# Patient Record
Sex: Female | Born: 1935 | Race: Black or African American | Hispanic: No | Smoking: Never smoker
Health system: Southern US, Community
[De-identification: ages and names within clinical notes are randomized; demographics above are authoritative.]

## PROBLEM LIST (undated history)

## (undated) DIAGNOSIS — I2510 Atherosclerotic heart disease of native coronary artery without angina pectoris: Secondary | ICD-10-CM

## (undated) DIAGNOSIS — N83209 Unspecified ovarian cyst, unspecified side: Secondary | ICD-10-CM

## (undated) DIAGNOSIS — I10 Essential (primary) hypertension: Secondary | ICD-10-CM

## (undated) DIAGNOSIS — I110 Hypertensive heart disease with heart failure: Secondary | ICD-10-CM

## (undated) DIAGNOSIS — R627 Adult failure to thrive: Secondary | ICD-10-CM

## (undated) DIAGNOSIS — A09 Infectious gastroenteritis and colitis, unspecified: Secondary | ICD-10-CM

## (undated) DIAGNOSIS — I351 Nonrheumatic aortic (valve) insufficiency: Secondary | ICD-10-CM

## (undated) DIAGNOSIS — R55 Syncope and collapse: Secondary | ICD-10-CM

## (undated) DIAGNOSIS — F0390 Unspecified dementia without behavioral disturbance: Secondary | ICD-10-CM

## (undated) DIAGNOSIS — E785 Hyperlipidemia, unspecified: Secondary | ICD-10-CM

## (undated) DIAGNOSIS — I43 Cardiomyopathy in diseases classified elsewhere: Secondary | ICD-10-CM

## (undated) DIAGNOSIS — I422 Other hypertrophic cardiomyopathy: Principal | ICD-10-CM

## (undated) DIAGNOSIS — I119 Hypertensive heart disease without heart failure: Secondary | ICD-10-CM

## (undated) HISTORY — DX: Syncope and collapse: R55

## (undated) HISTORY — DX: Other hypertrophic cardiomyopathy: I42.2

## (undated) HISTORY — DX: Hypertensive heart disease without heart failure: I11.9

## (undated) HISTORY — DX: Nonrheumatic aortic (valve) insufficiency: I35.1

## (undated) HISTORY — DX: Adult failure to thrive: R62.7

## (undated) HISTORY — DX: Hypertensive heart disease with heart failure: I11.0

---

## 2014-09-08 ENCOUNTER — Encounter (HOSPITAL_COMMUNITY): Payer: Self-pay | Admitting: Emergency Medicine

## 2014-09-08 ENCOUNTER — Observation Stay (HOSPITAL_COMMUNITY): Payer: Medicare Other

## 2014-09-08 ENCOUNTER — Observation Stay (HOSPITAL_COMMUNITY)
Admission: EM | Admit: 2014-09-08 | Discharge: 2014-09-10 | Disposition: A | Discharge status: Home / Self Care | Payer: Medicare Other | Attending: Family Medicine | Admitting: Family Medicine

## 2014-09-08 ENCOUNTER — Emergency Department (HOSPITAL_COMMUNITY): Payer: Medicare Other

## 2014-09-08 DIAGNOSIS — I119 Hypertensive heart disease without heart failure: Secondary | ICD-10-CM | POA: Diagnosis not present

## 2014-09-08 DIAGNOSIS — F0390 Unspecified dementia without behavioral disturbance: Secondary | ICD-10-CM | POA: Diagnosis not present

## 2014-09-08 DIAGNOSIS — I2510 Atherosclerotic heart disease of native coronary artery without angina pectoris: Secondary | ICD-10-CM | POA: Insufficient documentation

## 2014-09-08 DIAGNOSIS — I43 Cardiomyopathy in diseases classified elsewhere: Secondary | ICD-10-CM | POA: Diagnosis not present

## 2014-09-08 DIAGNOSIS — E785 Hyperlipidemia, unspecified: Secondary | ICD-10-CM | POA: Diagnosis not present

## 2014-09-08 DIAGNOSIS — R55 Syncope and collapse: Secondary | ICD-10-CM | POA: Diagnosis not present

## 2014-09-08 DIAGNOSIS — I10 Essential (primary) hypertension: Secondary | ICD-10-CM

## 2014-09-08 DIAGNOSIS — I252 Old myocardial infarction: Secondary | ICD-10-CM | POA: Diagnosis not present

## 2014-09-08 HISTORY — DX: Essential (primary) hypertension: I10

## 2014-09-08 HISTORY — DX: Hyperlipidemia, unspecified: E78.5

## 2014-09-08 HISTORY — DX: Cardiomyopathy in diseases classified elsewhere: I43

## 2014-09-08 HISTORY — DX: Infectious gastroenteritis and colitis, unspecified: A09

## 2014-09-08 HISTORY — DX: Unspecified ovarian cyst, unspecified side: N83.209

## 2014-09-08 HISTORY — DX: Unspecified dementia, unspecified severity, without behavioral disturbance, psychotic disturbance, mood disturbance, and anxiety: F03.90

## 2014-09-08 HISTORY — DX: Hypertensive heart disease without heart failure: I11.9

## 2014-09-08 HISTORY — DX: Atherosclerotic heart disease of native coronary artery without angina pectoris: I25.10

## 2014-09-08 LAB — COMPREHENSIVE METABOLIC PANEL
ALK PHOS: 67 U/L (ref 39–117)
ALT: 8 U/L (ref 0–35)
AST: 17 U/L (ref 0–37)
Albumin: 3.2 g/dL — ABNORMAL LOW (ref 3.5–5.2)
Anion gap: 10 (ref 5–15)
BILIRUBIN TOTAL: 0.5 mg/dL (ref 0.3–1.2)
BUN: 11 mg/dL (ref 6–23)
CALCIUM: 10.1 mg/dL (ref 8.4–10.5)
CHLORIDE: 106 meq/L (ref 96–112)
CO2: 26 meq/L (ref 19–32)
Creatinine, Ser: 0.89 mg/dL (ref 0.50–1.10)
GFR calc non Af Amer: 57 mL/min — ABNORMAL LOW (ref >90)
GFR, EST AFRICAN AMERICAN: 67 mL/min — AB (ref >90)
GLUCOSE: 127 mg/dL — AB (ref 70–99)
Potassium: 3.5 mEq/L — ABNORMAL LOW (ref 3.7–5.3)
SODIUM: 142 meq/L (ref 137–147)
Total Protein: 6.8 g/dL (ref 6.0–8.3)

## 2014-09-08 LAB — URINALYSIS, ROUTINE W REFLEX MICROSCOPIC
BILIRUBIN URINE: NEGATIVE
Glucose, UA: NEGATIVE mg/dL
HGB URINE DIPSTICK: NEGATIVE
KETONES UR: NEGATIVE mg/dL
Leukocytes, UA: NEGATIVE
Nitrite: NEGATIVE
PH: 7 (ref 5.0–8.0)
PROTEIN: NEGATIVE mg/dL
Specific Gravity, Urine: 1.011 (ref 1.005–1.030)
Urobilinogen, UA: 1 mg/dL (ref 0.0–1.0)

## 2014-09-08 LAB — CBC WITH DIFFERENTIAL/PLATELET
BASOS ABS: 0 10*3/uL (ref 0.0–0.1)
Basophils Relative: 0 % (ref 0–1)
Eosinophils Absolute: 0 10*3/uL (ref 0.0–0.7)
Eosinophils Relative: 1 % (ref 0–5)
HEMATOCRIT: 32.9 % — AB (ref 36.0–46.0)
Hemoglobin: 11.1 g/dL — ABNORMAL LOW (ref 12.0–15.0)
LYMPHS ABS: 1.2 10*3/uL (ref 0.7–4.0)
LYMPHS PCT: 19 % (ref 12–46)
MCH: 27.6 pg (ref 26.0–34.0)
MCHC: 33.7 g/dL (ref 30.0–36.0)
MCV: 81.8 fL (ref 78.0–100.0)
Monocytes Absolute: 0.5 10*3/uL (ref 0.1–1.0)
Monocytes Relative: 8 % (ref 3–12)
NEUTROS ABS: 4.5 10*3/uL (ref 1.7–7.7)
Neutrophils Relative %: 72 % (ref 43–77)
PLATELETS: 232 10*3/uL (ref 150–400)
RBC: 4.02 MIL/uL (ref 3.87–5.11)
RDW: 14.7 % (ref 11.5–15.5)
WBC: 6.2 10*3/uL (ref 4.0–10.5)

## 2014-09-08 LAB — CBG MONITORING, ED: Glucose-Capillary: 107 mg/dL — ABNORMAL HIGH (ref 70–99)

## 2014-09-08 LAB — TROPONIN I: Troponin I: <0.3 ng/mL (ref <0.30)

## 2014-09-08 MED ORDER — ASPIRIN 81 MG PO CHEW
81.0000 mg | CHEWABLE_TABLET | Freq: Every day | ORAL | Status: DC
  Administered 2014-09-09 – 2014-09-10 (×2): 81 mg via ORAL
  Filled 2014-09-08 (×3): qty 1

## 2014-09-08 MED ORDER — ATORVASTATIN CALCIUM 80 MG PO TABS
80.0000 mg | ORAL_TABLET | Freq: Every day | ORAL | Status: DC
  Administered 2014-09-09 – 2014-09-10 (×2): 80 mg via ORAL
  Filled 2014-09-08 (×2): qty 1

## 2014-09-08 MED ORDER — CARVEDILOL 12.5 MG PO TABS
12.5000 mg | ORAL_TABLET | Freq: Two times a day (BID) | ORAL | Status: DC
  Administered 2014-09-09 – 2014-09-10 (×4): 12.5 mg via ORAL
  Filled 2014-09-08 (×6): qty 1

## 2014-09-08 MED ORDER — SODIUM CHLORIDE 0.9 % IJ SOLN
3.0000 mL | Freq: Two times a day (BID) | INTRAMUSCULAR | Status: DC
  Administered 2014-09-09 (×2): 3 mL via INTRAVENOUS

## 2014-09-08 MED ORDER — SODIUM CHLORIDE 0.9 % IJ SOLN
3.0000 mL | Freq: Two times a day (BID) | INTRAMUSCULAR | Status: DC
  Administered 2014-09-10: 3 mL via INTRAVENOUS

## 2014-09-08 MED ORDER — SODIUM CHLORIDE 0.9 % IV SOLN: INTRAVENOUS | Status: AC

## 2014-09-08 MED ORDER — SODIUM CHLORIDE 0.9 % IV SOLN
1000.0000 mL | INTRAVENOUS | Status: DC
  Administered 2014-09-08: 1000 mL via INTRAVENOUS

## 2014-09-08 MED ORDER — SODIUM CHLORIDE 0.9 % IJ SOLN
3.0000 mL | INTRAMUSCULAR | Status: DC | PRN
Start: 2014-09-08 — End: 2014-09-10

## 2014-09-08 MED ORDER — SODIUM CHLORIDE 0.9 % IV SOLN: 250.0000 mL | INTRAVENOUS | Status: DC | PRN

## 2014-09-08 MED ORDER — LISINOPRIL 40 MG PO TABS
40.0000 mg | ORAL_TABLET | Freq: Every day | ORAL | Status: DC
  Administered 2014-09-09 – 2014-09-10 (×2): 40 mg via ORAL
  Filled 2014-09-08 (×2): qty 1

## 2014-09-08 MED ORDER — POTASSIUM CHLORIDE CRYS ER 20 MEQ PO TBCR
40.0000 meq | EXTENDED_RELEASE_TABLET | Freq: Once | ORAL | Status: AC
  Administered 2014-09-09: 40 meq via ORAL
  Filled 2014-09-08: qty 2

## 2014-09-08 NOTE — H&P (Signed)
Family Medicine Teaching [facility] Admission History and Physical Service Pager: [phone]  Patient name: Charlotte Schultz Medical record number: [id number] Date of birth: 21-Apr-1927 Age: 80 y.o. Gender: female  Primary Care Provider: No primary provider on file. Consultants: None Code Status: Full Code  Chief Complaint: Syncope  Assessment and Plan: Devita Hewatt is a 80 y.o. female presenting with 2 syncopal episodes . PMH is significant for HTN, HLD, CAD, dementia, and history of MI.  #Syncope - 2 witnessed episodes. Unclear etiology at this time. DDx includes neurocardiogenic(per history does not seem to be situational or positional in nature) vs cardiovascular(EKG showed NSR with some T wave abnormalities; no arrhythmias noted. Patient does have murmur on exam and very wide pulse pressure) vs neurologic(does not appear to be seizure-like activity and patient has no history of seizures; also normal neuro exam excluding known dementia).  Patient with no current evidence of dehydration, infection.   -Admit to telemetry -CXR showed no active cardiopulmonary disease -CT Head ordered given fall -repeat EKG in am -echo to assess for underlying valvular/cardiac abnormalities -will cycle troponin x 3  -TSH, A1C ordered  -will get orthostatics in the morning -fall precautions  #HTN - BPs currently elevated.  -Continued home Coreg, Lisinopril and withheld Norvasc and hydralazine at this time pending CT head  -Monitor pressures and add back medication as needed.  #HLD  -Continue home Lipitor 80mg   #CAD - Hx of MI about 10 years ago. -Will continue home aspirin, statin, and BP meds as noted above.  #Dementia - Patient currently not oriented. Not on any medications for this. Patient gets assistance with daily ADLs. Likely advanced given history and duration from family members. -Unable to get MMSE -Patient unlikely to receive benefit from medications if dementia is advanced  (anticipate this is the case).  Will need to assess MMSE during admission.  -Consider MRI during admission for work up -Obtaining TSH and Anemia Panel  FEN/GI: Heart Healthy Diet.  Prophylaxis: SCDs in setting of recent fall (if CT negative will proceed with Heparin).   Disposition: Admit to telemetry under attending Dr. Gwendolyn Grant.   History of Present Illness: Charlotte Schultz is a 80 y.o. female presenting with 2 witnessed syncopal episodes. PMH is significant for HTN, HLD, CAD, dementia, and history of MI.  Level 5 caveat due to dementia. Below history was provided by family mainly daughter in law.   Patient was leaving car going to apartment with daughter in law when she passed out. She came to a couple minutes later and then loss consciousness again. She did not hit her head or become incontinent when she passed out. No reported seizure like activity. After second episode EMS was called.  Patient was alert and at baseline a few minutes following syncope.  She has been in her normal state of health per family. These episodes occurred without prodrome of symptoms. Patient denies any symptoms at this time.   Family states the patient has been evaluated for syncope in the past, at another medical facility, without clear etiology demonstrated. When this has happened previously patient usually was dehydrated or had an infection. She also would have an episode of vomiting which did not occur this time.  No report of new medication changes or behavioral changes.   Patient currently denies chest pain, SOB, nausea, vomiting.  She and daughter in law report good PO intake.  Of note, patient lives with son and daughter in law.  Daughter in law is primary caregiver.  Patient is  ambulatory (no assistive devices); needs assistance with ADL's.   Review Of Systems: Per HPI. Otherwise 12 point review of systems was performed and was unremarkable.  Patient Active Problem List   Diagnosis Date Noted  .  Syncope and collapse 09/08/2014   Past Medical History: Past Medical History  Diagnosis Date  . Hypertension   . Infectious colitis   . HLD (hyperlipidemia)   . Hypertensive cardiomyopathy   . Ovarian cyst   . CAD (coronary artery disease)   . Dementia    Past Surgical History: History reviewed. No pertinent past surgical history. Social History: History  Substance Use Topics  . Smoking status: Not on file  . Smokeless tobacco: Not on file  . Alcohol Use: Not on file   Additional social history: Lives with son and daughter in law. Please also refer to relevant sections of EMR.  Family History: History reviewed. No pertinent family history. Allergies and Medications: Allergies  Allergen Reactions  . Zetia [Ezetimibe]     unknown  . Aspirin Rash   No current facility-administered medications on file prior to encounter.   No current outpatient prescriptions on file prior to encounter.    Objective: BP 199/51  Pulse 89  Temp(Src) 98.1 F (36.7 C) (Oral)  Resp 18  Ht 5\' 6"  (1.676 m)  Wt 120 lb 3.2 oz (54.522 kg)  BMI 19.41 kg/m2  SpO2 100% Exam: General: alert, well-developed, NAD, cooperative HEENT: NCAT. Vision grossly intact, PERRLA, no injection and anicteric. Extraocular motion intact. MMM Oral mucosa and oropharynx reveal no lesions or exudates. Poor dentition. Neck: supple, full ROM, no thyromegaly. No deformities, masses, or tenderness noted.  Lungs: CTAB, normal respiratory effort, no accessory muscle use, no crackles, and no wheezes.  Heart: RRR, 3/6 blowing systolic murmur.  Abdomen: Bowel sounds normal; abdomen soft and nontender. No masses, organomegaly or hernias noted. no guarding, no rebound tenderness. Lower abdominal transverse scar. Msk: no joint swelling, no joint warmth, and no redness over joints. Pulses: DP/PT are full and equal bilaterally.  Extremities: No cyanosis, clubbing, edema. Pressure soars on bilateral feet.  Neurologic: No focal  deficits, CN grossly intact. 5/5 muscle strength in all extremities; Sensation grossly intact. Alert but not oriented. Skin: Intact without suspicious lesions or rashes. Warm and dry.   Labs and Imaging: CBC BMET   Recent Labs Lab 09/08/14 1835  WBC 6.2  HGB 11.1*  HCT 32.9*  PLT 232    Recent Labs Lab 09/08/14 1835  NA 142  K 3.5*  CL 106  CO2 26  BUN 11  CREATININE 0.89  GLUCOSE 127*  CALCIUM 10.1     Urinalysis    Component Value Date/Time   COLORURINE YELLOW 09/08/2014 1850   APPEARANCEUR CLEAR 09/08/2014 1850   LABSPEC 1.011 09/08/2014 1850   PHURINE 7.0 09/08/2014 1850   GLUCOSEU NEGATIVE 09/08/2014 1850   HGBUR NEGATIVE 09/08/2014 1850   BILIRUBINUR NEGATIVE 09/08/2014 1850   KETONESUR NEGATIVE 09/08/2014 1850   PROTEINUR NEGATIVE 09/08/2014 1850   UROBILINOGEN 1.0 09/08/2014 1850   NITRITE NEGATIVE 09/08/2014 1850   LEUKOCYTESUR NEGATIVE 09/08/2014 1850   Trop neg x1  Dg Chest 2 View 09/08/2014   CLINICAL DATA:  Pt from home with two unwitnessed syncopal episodes. Unknown how long pt was unconscious for. Pt with no complaints or neurological deficits. Pt hypertensive with EMS and hypertensive on arrival. Hx of dementia.  EXAM: CHEST  2 VIEW  COMPARISON:  None.  FINDINGS: There is no focal parenchymal opacity, pleural  effusion, or pneumothorax. There is cardiomegaly. There is thoracic aortic atherosclerosis.  There is a levoscoliosis of the thoracolumbar spine. There is severe osteoarthritis of the left glenohumeral joint.  IMPRESSION: No active cardiopulmonary disease.   Electronically Signed   By: Elige Ko   On: 09/08/2014 18:31   Pincus Large, DO 09/08/2014, 11:02 PM PGY-1, [facility] FPTS Intern pager: [phone], text pages welcome  I have seen and evaluated the above patient.  Addendum in blue.  Everlene Other DO Family Medicine PGY-3

## 2014-09-08 NOTE — ED Notes (Signed)
Reports given to RN but room is not ready at this time. Floor will contact when ready.

## 2014-09-08 NOTE — ED Notes (Signed)
Pt from home with two unwitnessed syncopal episodes.  Unknown how long pt was unconscious for.  Pt with no complaints or neurological deficits.  Pt hypertensive with EMS and hypertensive on arrival.  Pt with hx of dementia.  Family en route. IV established by EMS.

## 2014-09-08 NOTE — ED Notes (Signed)
CBG 107 

## 2014-09-08 NOTE — ED Provider Notes (Signed)
CSN: [id number]     Arrival date & time 09/08/14  1706 History   First MD Initiated Contact with Patient 09/08/14 1724     Chief Complaint  Patient presents with  . Loss of Consciousness      HPI  Patient presents after 2 episodes of syncope. Patient has dementia, level V caveat. Course the patient's family is here, provides details of the history of present illness.  1 daughter notes that twice today, without clear prodrome, the patient had brief loss of consciousness. The patient did not report pain either before or after each episode. Currently the patient denies pain, lightheadedness, nausea, any focal complaints. Family states the patient has been evaluated for syncope in the past, at another medical facility, without clear etiology demonstrated. No report of new medication changes, and I changed her activity changes, and no behavioral changes  Past Medical History  Diagnosis Date  . Hypertension   . Infectious colitis   . HLD (hyperlipidemia)   . Hypertensive cardiomyopathy   . Ovarian cyst    History reviewed. No pertinent past surgical history. History reviewed. No pertinent family history. History  Substance Use Topics  . Smoking status: Not on file  . Smokeless tobacco: Not on file  . Alcohol Use: Not on file   OB History   Grav Para Term Preterm Abortions TAB SAB Ect Mult Living                 Review of Systems  Unable to perform ROS: Dementia      Allergies  Zetia and Aspirin  Home Medications   Prior to Admission medications   Medication Sig Start Date End Date Taking? Authorizing Provider  amLODipine (NORVASC) 5 MG tablet Take 5 mg by mouth daily.   Yes Historical Provider, MD  aspirin 81 MG tablet Take 81 mg by mouth daily.   Yes Historical Provider, MD  atorvastatin (LIPITOR) 80 MG tablet Take 80 mg by mouth daily.   Yes Historical Provider, MD  carvedilol (COREG) 12.5 MG tablet Take 12.5 mg by mouth every 12 (twelve) hours.   Yes Historical  Provider, MD  hydrALAZINE (APRESOLINE) 25 MG tablet Take 25 mg by mouth daily.   Yes Historical Provider, MD  lisinopril (PRINIVIL,ZESTRIL) 40 MG tablet Take 40 mg by mouth daily.   Yes Historical Provider, MD   BP 167/58  Pulse 80  Temp(Src) 99 F (37.2 C) (Oral)  Resp 17  SpO2 100% Physical Exam  Nursing note and vitals reviewed. Constitutional: She appears listless. She has a sickly appearance. No distress.  Eyes: Conjunctivae are normal. Right eye exhibits no discharge. Left eye exhibits no discharge.  Neck: No tracheal deviation present.  Cardiovascular: Normal rate and regular rhythm.   Murmur heard. Pulmonary/Chest: Effort normal. No stridor. No respiratory distress.  Abdominal: She exhibits no distension. There is no tenderness.  Musculoskeletal:  Diffuse atrophy, no asymmetry, no gross evidence of trauma.  Neurological: She appears listless. She displays atrophy. She displays no tremor. She exhibits abnormal muscle tone. She displays no seizure activity. Coordination abnormal.  No facial asymmetry, speech clear, brief. Patient does not follow commands for neurologic exam reliably, including a demonstration of or cerebellar function, poor command following    ED Course  Procedures (including critical care time) Labs Review Labs Reviewed  CBC WITH DIFFERENTIAL - Abnormal; Notable for the following:    Hemoglobin 11.1 (*)    HCT 32.9 (*)    All other components within normal limits  COMPREHENSIVE METABOLIC PANEL - Abnormal; Notable for the following:    Potassium 3.5 (*)    Glucose, Bld 127 (*)    Albumin 3.2 (*)    GFR calc non Af Amer 57 (*)    GFR calc Af Amer 67 (*)    All other components within normal limits  CBG MONITORING, ED - Abnormal; Notable for the following:    Glucose-Capillary 107 (*)    All other components within normal limits  TROPONIN I  URINALYSIS, ROUTINE W REFLEX MICROSCOPIC  POCT CBG (FASTING - GLUCOSE)-MANUAL ENTRY    Imaging Review Dg  Chest 2 View  09/08/2014   CLINICAL DATA:  Pt from home with two unwitnessed syncopal episodes. Unknown how long pt was unconscious for. Pt with no complaints or neurological deficits. Pt hypertensive with EMS and hypertensive on arrival. Hx of dementia.  EXAM: CHEST  2 VIEW  COMPARISON:  None.  FINDINGS: There is no focal parenchymal opacity, pleural effusion, or pneumothorax. There is cardiomegaly. There is thoracic aortic atherosclerosis.  There is a levoscoliosis of the thoracolumbar spine. There is severe osteoarthritis of the left glenohumeral joint.  IMPRESSION: No active cardiopulmonary disease.   Electronically Signed   By: Elige KoHetal  Patel   On: 09/08/2014 18:31     EKG Interpretation   Date/Time:  Thursday September 08 2014 17:14:21 EDT Ventricular Rate:  83 PR Interval:  154 QRS Duration: 96 QT Interval:  392 QTC Calculation: 461 R Axis:   -39 Text Interpretation:  Sinus rhythm Left atrial enlargement LVH with  secondary repolarization abnormality Anterior Q waves, possibly due to LVH  Sinus rhythm T wave abnormality Left ventricular hypertrophy Abnormal ekg  Confirmed by Gerhard MunchLOCKWOOD, Jacole Capley  MD [phone] on 09/08/2014 7:26:16 PM     On repeat exam the patient is in no distress MDM   Patient presents after 2 episodes of syncope.  Given the patient's age, risk factors, the unclear circumstances, although her initial evaluation here was reassuring, she was admitted for further evaluation and management.   Gerhard Munchobert Canisha Issac, MD 09/08/14 2037

## 2014-09-08 NOTE — ED Notes (Signed)
Pt transported to Xray. 

## 2014-09-09 ENCOUNTER — Encounter (HOSPITAL_COMMUNITY): Payer: Self-pay | Admitting: *Deleted

## 2014-09-09 ENCOUNTER — Observation Stay (HOSPITAL_COMMUNITY): Payer: Medicare Other

## 2014-09-09 DIAGNOSIS — I10 Essential (primary) hypertension: Secondary | ICD-10-CM

## 2014-09-09 DIAGNOSIS — R55 Syncope and collapse: Principal | ICD-10-CM

## 2014-09-09 DIAGNOSIS — F0390 Unspecified dementia without behavioral disturbance: Secondary | ICD-10-CM

## 2014-09-09 DIAGNOSIS — I359 Nonrheumatic aortic valve disorder, unspecified: Secondary | ICD-10-CM

## 2014-09-09 LAB — RETICULOCYTES
RBC.: 3.4 MIL/uL — ABNORMAL LOW (ref 3.87–5.11)
Retic Count, Absolute: 30.6 10*3/uL (ref 19.0–186.0)
Retic Ct Pct: 0.9 % (ref 0.4–3.1)

## 2014-09-09 LAB — HEMOGLOBIN A1C
Hgb A1c MFr Bld: 5.5 % (ref <5.7)
Mean Plasma Glucose: 111 mg/dL (ref <117)

## 2014-09-09 LAB — TSH: TSH: 0.8 u[IU]/mL (ref 0.350–4.500)

## 2014-09-09 LAB — TROPONIN I
Troponin I: <0.3 ng/mL (ref <0.30)
Troponin I: <0.3 ng/mL (ref <0.30)

## 2014-09-09 LAB — FOLATE: FOLATE: 14.6 ng/mL

## 2014-09-09 LAB — IRON AND TIBC
IRON: 34 ug/dL — AB (ref 42–135)
Saturation Ratios: 20 % (ref 20–55)
TIBC: 173 ug/dL — AB (ref 250–470)
UIBC: 139 ug/dL (ref 125–400)

## 2014-09-09 LAB — VITAMIN B12: Vitamin B-12: 578 pg/mL (ref 211–911)

## 2014-09-09 LAB — FERRITIN: FERRITIN: 82 ng/mL (ref 10–291)

## 2014-09-09 MED ORDER — ENSURE COMPLETE PO LIQD
237.0000 mL | Freq: Two times a day (BID) | ORAL | Status: DC
  Administered 2014-09-09 – 2014-09-10 (×3): 237 mL via ORAL

## 2014-09-09 MED ORDER — HEPARIN SODIUM (PORCINE) 5000 UNIT/ML IJ SOLN
5000.0000 [IU] | Freq: Three times a day (TID) | INTRAMUSCULAR | Status: DC
  Administered 2014-09-09 – 2014-09-10 (×4): 5000 [IU] via SUBCUTANEOUS
  Filled 2014-09-09 (×7): qty 1

## 2014-09-09 NOTE — Progress Notes (Signed)
EEG completed; results pending.    

## 2014-09-09 NOTE — Progress Notes (Signed)
Pt refused continuous pulse oximetry monitoring. Oxygen saturations 100% on room air. Will continue to monitor.

## 2014-09-09 NOTE — Progress Notes (Signed)
INITIAL NUTRITION ASSESSMENT  DOCUMENTATION CODES Per approved criteria  -Not Applicable   INTERVENTION: Ensure Complete po BID, each supplement provides 350 kcal and 13 grams of protein RD to follow for nutrition care plan  NUTRITION DIAGNOSIS: Predicted suboptimal intake related to dementia as evidenced by Malnutrition Screening Tool   Goal: Pt to meet >/= 90% of their estimated nutrition needs   Monitor:  PO & supplemental intake, weight, labs, I/O's  Reason for Assessment: Malnutrition Screening Tool Report  80 y.o. female  Admitting Dx: syncope  ASSESSMENT: 80 y.o. Female with PMH significant for HTN, HLD, CAD, dementia, and history of MI; presented with 2 syncopal episodes.  RD unable to obtain much hx from patient; she is confused; no family at bedside; per Malnutrition Screening Tool Report, pt has been eating poorly because of a decreased appetite and has had recent weight loss without trying; helped pt set up her tray table for lunch; she would like Ensure during her hospital stay; RD to order.  RD unable to complete Nutrition Focused Physical Exam at this time.  Height: Ht Readings from Last 1 Encounters:  09/08/14 5\' 6"  (1.676 m)    Weight: Wt Readings from Last 1 Encounters:  09/08/14 120 lb 3.2 oz (54.522 kg)    Ideal Body Weight: 130 lb  % Ideal Body Weight: 92%  Wt Readings from Last 20 Encounters:  09/08/14 120 lb 3.2 oz (54.522 kg)    Usual Body Weight: unable to obtain  % Usual Body Weight: ---  BMI:  Body mass index is 19.41 kg/(m^2).  Estimated Nutritional Needs: Kcal: 1350-1550 Protein: 65-75 gm Fluid: >/= 1.5 L  Skin: Intact  Diet Order: Cardiac  EDUCATION NEEDS: -No education needs identified at this time   Intake/Output Summary (Last 24 hours) at 09/09/14 1333 Last data filed at 09/09/14 1100  Gross per 24 hour  Intake      0 ml  Output    300 ml  Net   -300 ml    Labs:   Recent Labs Lab 09/08/14 1835  NA 142   K 3.5*  CL 106  CO2 26  BUN 11  CREATININE 0.89  CALCIUM 10.1  GLUCOSE 127*    CBG (last 3)   Recent Labs  09/08/14 1835  GLUCAP 107*    Scheduled Meds: . sodium chloride   Intravenous STAT  . aspirin  81 mg Oral Daily  . atorvastatin  80 mg Oral Daily  . carvedilol  12.5 mg Oral BID WC  . heparin subcutaneous  5,000 Units Subcutaneous 3 times per day  . lisinopril  40 mg Oral Daily  . sodium chloride  3 mL Intravenous Q12H  . sodium chloride  3 mL Intravenous Q12H    Continuous Infusions:   Past Medical History  Diagnosis Date  . Hypertension   . Infectious colitis   . HLD (hyperlipidemia)   . Hypertensive cardiomyopathy   . Ovarian cyst   . CAD (coronary artery disease)   . Dementia     History reviewed. No pertinent past surgical history.  Maureen Chatters, RD, LDN Pager #: [phone] After-Hours Pager #: [phone]

## 2014-09-09 NOTE — Evaluation (Signed)
Occupational Therapy Evaluation and Discharge Patient Details Name: Charlotte Schultz MRN: [id number] DOB: [birth year] Today's Date: 09/09/2014    History of Present Illness Charlotte Schultz is a 80 y.o. female presenting with 2 syncopal episodes . PMH is significant for HTN, HLD, CAD, dementia, and history of MI   Clinical Impression   This 80 yo female admitted with above presents to acute OT at a min guard to S level and has this at home. No further OT needs, we will sign off.    Follow Up Recommendations  No OT follow up;Supervision/Assistance - 24 hour    Equipment Recommendations  Tub/shower seat (for safety--not sure family/patient can afford)       Precautions / Restrictions Precautions Precautions: Fall Restrictions Weight Bearing Restrictions: No      Mobility Bed Mobility Overal bed mobility: Modified Independent                Transfers Overall transfer level: Needs assistance Equipment used: Rolling walker (2 wheeled) Transfers: Sit to/from Stand Sit to Stand: Min guard                   ADL                                         General ADL Comments: Min guard A when up on her feet, otherwise S.               Pertinent Vitals/Pain Pain Assessment: No/denies pain        Extremity/Trunk Assessment Upper Extremity Assessment Upper Extremity Assessment: Overall WFL for tasks assessed   Lower Extremity Assessment Lower Extremity Assessment: Defer to PT evaluation       Communication Communication Communication: No difficulties   Cognition Arousal/Alertness: Awake/alert Behavior During Therapy: WFL for tasks assessed/performed Overall Cognitive Status: History of cognitive impairments - at baseline                                Home Living Family/patient expects to be discharged to:: Private residence Living Arrangements: Children (with daughter in law the primary caregiver) Available Help at  Discharge: Family;Available 24 hours/day         Home Layout: One level     Bathroom Shower/Tub: Tub/shower unit;Curtain Shower/tub characteristics: Engineer, building services: Standard     Home Equipment: Cane - single point   Additional Comments: Daughter-in-law says pt turns the cane upside down and cannot be redirected to use it correctly.      Prior Functioning/Environment Level of Independence: Needs assistance  Gait / Transfers Assistance Needed: someone is always with her when she is up on her feet at home ADL's / Homemaking Assistance Needed: A with B'D due to dementia--feel she can do more, but family say she regresses at home and will not do as much as she does when she is in the hosptial        OT Diagnosis: Generalized weakness;Cognitive deficits         OT Goals(Current goals can be found in the care plan section) Acute Rehab OT Goals Patient Stated Goal: family wants pt to use a RW  OT Frequency:                End of Session Equipment Utilized During Treatment: Gait belt;Rolling walker  Activity Tolerance: Patient tolerated treatment  well Patient left: in chair;with call bell/phone within reach;with chair alarm set   Time: [id number] OT Time Calculation (min): 20 min Charges:  OT Evaluation $Initial OT Evaluation Tier I: 1 Procedure OT Treatments $Self Care/Home Management : 8-22 mins G-Codes: OT G-codes **NOT FOR INPATIENT CLASS** Functional Assessment Tool Used: Clinical observation Functional Limitation: Self care Self Care Current Status (Y7829(G8987): At least 1 percent but less than 20 percent impaired, limited or restricted Self Care Goal Status ([id number](G8988): At least 1 percent but less than 20 percent impaired, limited or restricted Self Care Discharge Status [phone]([id number]): At least 1 percent but less than 20 percent impaired, limited or restricted  Evette GeorgesLeonard, Fadi Menter Eva [phone] 09/09/2014, 1:42 PM

## 2014-09-09 NOTE — H&P (Signed)
FMTS Attending Admission Note: Renold Don MD Personal pager:  [phone] FPTS Service Pager:  [phone]  I  have seen and examined this patient, reviewed their chart. I have discussed this patient with the resident. I agree with the resident's findings, assessment and care plan.  Additionally:  80 yo F with known dementia, HTN, CAD, HLD, and no prior records who presents with 1 day histoyr of witnessed syncope.  Has occurred several times in past as well.  Family not available at bedside this AM.  Patient eating breakfast, awake and alert to only person.  THinks she is at her son's house.  Has diastolic murmur loudest tricuspid/mitral areas.  Regular rhythm.  Lungs sound good.  ABdomen benign.    Imp/Plan: 1. Syncope:  - no prior wu records here.   - with EKG showing some T wave abnormalities in lateral leads.  Troponins negative.  No prior EKG - tele ok o/n - agree with Echo.  Unclear how aggressive family would choice to be pending results  2.  Dementia: - biggest issue for patient.   - likely low MMSE.  AGree this should be done in-house.   - would use these results to help guide tx decisions  3.  HTN:   - bp's better this AM  Tobey Grim, MD 09/09/2014 8:35 AM

## 2014-09-09 NOTE — Procedures (Signed)
ELECTROENCEPHALOGRAM REPORT   Patient: Charlotte Schultz       Room #: 1P50 EEG No. ID: [id number] Age: 80 y.o.        Sex: female Referring Physician: Gwendolyn Grant Report Date:  09/09/2014        Interpreting Physician: Thana Farr D  History: [name] is an 80 y.o. female with multiple syncopal episodes  Medications:  Scheduled: . sodium chloride   Intravenous STAT  . aspirin  81 mg Oral Daily  . atorvastatin  80 mg Oral Daily  . carvedilol  12.5 mg Oral BID WC  . feeding supplement (ENSURE COMPLETE)  237 mL Oral BID BM  . heparin subcutaneous  5,000 Units Subcutaneous 3 times per day  . lisinopril  40 mg Oral Daily  . sodium chloride  3 mL Intravenous Q12H  . sodium chloride  3 mL Intravenous Q12H    Conditions of Recording:  This is a 16 channel EEG carried out with the patient in the awake state.  Description:  The waking background activity consists of a low voltage, fairly well organized, 8-9 Hz alpha activity, seen from the parieto-occipital and posterior temporal regions.  This is better sustained over the right hemisphere than it is over the left.  Low voltage fast activity, poorly organized, is seen anteriorly and is at times superimposed on more posterior regions.  A mixture of theta and alpha rhythms are seen from the central and temporal regions. The patient does not drowse or sleep. Hyperventilation was not performed.  Intermittent photic stimulation was performed but failed to illicit any change in the tracing.     IMPRESSION: This is a normal electroencephalogram with only some mild asymmetry noted.  No epileptiform activity is noted.    Comment:  An EEG with the patient sleep deprived to elicit drowse and light sleep may be desirable to further elicit a possible seizure disorder.    Thana Farr, MD [facility] [phone] 09/09/2014, 6:50 PM

## 2014-09-09 NOTE — Progress Notes (Signed)
Family Medicine Teaching Service Attending Note  I discussed patient Charlotte Schultz  with Dr. Doroteo Glassman and reviewed their note for today.  I agree with their assessment and plan.

## 2014-09-09 NOTE — Progress Notes (Signed)
Patient ID: [name], female   DOB: 10-25-1927, 80 y.o.   MRN: [id number] Family Medicine Teaching Service Daily Progress Note Intern Pager: [phone]  Patient name: Charlotte Schultz Medical record number: [id number] Date of birth: [birth year] Age: 80 y.o. Gender: female  Primary Care Provider: No primary provider on file. Consultants: None Code Status: Full  Pt Overview and Major Events to Date:  10/8: Patient admitted for observation after syncopal episodes  Assessment and Plan: Charlotte Schultz is a 80 y.o. female presenting with 2 syncopal episodes . PMH is significant for HTN, HLD, CAD, dementia, and history of MI.   #Syncope - 2 witnessed episodes. Unclear etiology at this time. DDx includes neurocardiogenic(per history does not seem to be situational or positional in nature) vs cardiovascular(EKG showed NSR with some T wave abnormalities; no arrhythmias noted. Patient does have murmur on exam) vs neurologic(does not appear to be seizure-like activity and patient has no history of seizures; also normal neuro exam excluding known dementia). Patient with no current evidence of dehydration, infection. -telemetry so far is none eventful; there are a couple of PVCs otherwise NSR -CXR showed no active cardiopulmonary disease  -CT Head ordered given fall - no acute processes seen -repeat EKG pending -EEG to check for seizure activity -Cycled troponins negative  -UA neg -TSH wnl, A1C pending  -negative orthostatics this morning -fall precautions in place -cardiology consulted; appreciate recs  -f/u PT/OT recs   #HTN - BPs very labile with wide pulse pressure. Most recent 138/36.  -Continued home Coreg, Lisinopril and withheld Norvasc and hydralazine  -Monitor pressures -cardiology consulted; appreciate recs  #HLD  -Continue home Lipitor 80mg    #CAD - Hx of MI about 10 years ago.  -Will continue home aspirin, statin, and BP meds as noted above.   #Dementia - Patient  currently not oriented. Not on any medications for this. Patient gets assistance with daily ADLs. Likely advanced given history and duration from family members.  -Unable to get MMSE again this morning; patient cannot answer any questions and got frustrated.  -CT heads shows mild cortical volume loss and scattered small vessel ischemic microangiopathy. -Patient unlikely to receive benefit from medications if dementia is advanced (anticipate this is the case).  -Will continue to have GOC discussion with family -Anemia Panel pending  FEN/GI: Heart Healthy Diet.  Prophylaxis: Subq Heparin.   Disposition: Continue current management as above; likely discharge back home today.  Subjective:  Patient is severely demented so it is hard to get accurate information from her. She does endorse dizziness but denies chest pain and SOB. She says she slept well. No overnight events.  Objective: Temp:  [98.1 F (36.7 C)-99 F (37.2 C)] 99 F (37.2 C) (10/09 0459) Pulse Rate:  [70-93] 84 (10/09 0459) Resp:  [16-18] 16 (10/09 0459) BP: (117-212)/(36-62) 138/36 mmHg (10/09 0459) SpO2:  [97 %-100 %] 97 % (10/09 0459) Weight:  [120 lb 3.2 oz (54.522 kg)] 120 lb 3.2 oz (54.522 kg) (10/08 2237)  Physical Exam: General: alert, well-developed, NAD, cooperative Lungs: CTAB, normal respiratory effort, no accessory muscle use, no crackles, and no wheezes.  Heart: RRR, 3/6 blowing systolic murmur.  Abdomen: Bowel sounds normal; abdomen soft and nontender.  Msk: no joint swelling, no joint warmth, and no redness over joints.  Extremities: No cyanosis, clubbing, edema. Pressure soars on bilateral feet. Warm and dry Neurologic: No focal deficits, CN grossly intact. 5/5 muscle strength in all extremities; Sensation grossly intact. Alert but not oriented.   Laboratory:  Recent Labs Lab 09/08/14 1835  WBC 6.2  HGB 11.1*  HCT 32.9*  PLT 232    Recent Labs Lab 09/08/14 1835  NA 142  K 3.5*  CL 106  CO2  26  BUN 11  CREATININE 0.89  CALCIUM 10.1  PROT 6.8  BILITOT 0.5  ALKPHOS 67  ALT 8  AST 17  GLUCOSE 127*   Urinalysis    Component Value Date/Time   COLORURINE YELLOW 09/08/2014 1850   APPEARANCEUR CLEAR 09/08/2014 1850   LABSPEC 1.011 09/08/2014 1850   PHURINE 7.0 09/08/2014 1850   GLUCOSEU NEGATIVE 09/08/2014 1850   HGBUR NEGATIVE 09/08/2014 1850   BILIRUBINUR NEGATIVE 09/08/2014 1850   KETONESUR NEGATIVE 09/08/2014 1850   PROTEINUR NEGATIVE 09/08/2014 1850   UROBILINOGEN 1.0 09/08/2014 1850   NITRITE NEGATIVE 09/08/2014 1850   LEUKOCYTESUR NEGATIVE 09/08/2014 1850   TSH- 0.8  Imaging/Diagnostic Tests: Dg Chest 2 View 09/08/2014  IMPRESSION: No active cardiopulmonary disease.     Ct Head Wo Contrast 09/08/2014  IMPRESSION: 1. No acute intracranial pathology seen on CT. 2. Mild cortical volume loss and scattered small vessel ischemic microangiopathy. 3. Near-complete opacification of the visualized portions of the left maxillary sinus.      Pincus LargeJazma Y Abelina Ketron, DO 09/09/2014, 6:40 AM PGY-1, [facility] Intern pager: [phone], text pages welcome

## 2014-09-09 NOTE — Consult Note (Signed)
Primary cardiologist: New  HPI: 80 year old female for evaluation of syncope. Note patient is demented and cannot provide a history. No family members are present at the time of this evaluation. History is gleaned from chart review. She was admitted last evening following 2 witnessed syncopal episodes. She apparently was walking from her car to her apartment when these episodes occurred. She was unconscious for 2 minutes. The patient has no recall of these events. She denies dyspnea or chest pain. She is presently alert and oriented to person only.  Medications Prior to Admission  Medication Sig Dispense Refill  . amLODipine (NORVASC) 5 MG tablet Take 5 mg by mouth daily.      Marland Kitchen aspirin 81 MG tablet Take 81 mg by mouth daily.      Marland Kitchen atorvastatin (LIPITOR) 80 MG tablet Take 80 mg by mouth daily.      . carvedilol (COREG) 12.5 MG tablet Take 12.5 mg by mouth every 12 (twelve) hours.      . hydrALAZINE (APRESOLINE) 25 MG tablet Take 25 mg by mouth daily.      Marland Kitchen lisinopril (PRINIVIL,ZESTRIL) 40 MG tablet Take 40 mg by mouth daily.        Allergies  Allergen Reactions  . Zetia [Ezetimibe]     unknown  . Aspirin Rash    Past Medical History  Diagnosis Date  . Hypertension   . Infectious colitis   . HLD (hyperlipidemia)   . Hypertensive cardiomyopathy   . Ovarian cyst   . CAD (coronary artery disease)   . Dementia     History reviewed. No pertinent past surgical history.  History   Social History  . Marital Status: Widowed    Spouse Name: N/A    Number of Children: N/A  . Years of Education: N/A   Occupational History  . Not on file.   Social History Main Topics  . Smoking status: Never Smoker   . Smokeless tobacco: Not on file  . Alcohol Use: No  . Drug Use: No  . Sexual Activity: Not on file   Other Topics Concern  . Not on file   Social History Narrative  . No narrative on file    Family History  Problem Relation Age of Onset  . Hypertension      ROS:   Difficult due to her dementia. However she has had a cough but denies hemoptysis. No fevers or chills. No melena or hematochezia.  Physical Exam:   Blood pressure 138/36, pulse 84, temperature 99 F (37.2 C), temperature source Oral, resp. rate 16, height 5' 6"  (1.676 m), weight 120 lb 3.2 oz (54.522 kg), SpO2 97.00%.  General:  Well developed/well nourished in NAD Skin warm/dry No peripheral clubbing Back-normal HEENT-normal/normal eyelids Neck supple/normal carotid upstroke bilaterally; no bruits; no JVD; no thyromegaly chest - CTA/ normal expansion CV - OEV/0/3 diastolic murmur left sternal border. 2/6 systolic murmur apex. No S3 or S4. Abdomen -NT/ND, no HSM, no mass, + bowel sounds, no bruit 2+ femoral pulses, no bruits Ext-no edema, chords; Diminished distal pulses Neuro-Patient moves all extremities. Severe dementia. Alert and oriented x1.  ECG Sinus rhythm, left axis deviation, left ventricular hypertrophy with repolarization abnormality, cannot rule out prior septal infarct, left atrial enlargement.  Results for orders placed during the hospital encounter of 09/08/14 (from the past 48 hour(s))  CBC WITH DIFFERENTIAL     Status: Abnormal   Collection Time    09/08/14  6:35 PM  Result Value Ref Range   WBC 6.2  4.0 - 10.5 K/uL   RBC 4.02  3.87 - 5.11 MIL/uL   Hemoglobin 11.1 (*) 12.0 - 15.0 g/dL   HCT 32.9 (*) 36.0 - 46.0 %   MCV 81.8  78.0 - 100.0 fL   MCH 27.6  26.0 - 34.0 pg   MCHC 33.7  30.0 - 36.0 g/dL   RDW 14.7  11.5 - 15.5 %   Platelets 232  150 - 400 K/uL   Neutrophils Relative % 72  43 - 77 %   Neutro Abs 4.5  1.7 - 7.7 K/uL   Lymphocytes Relative 19  12 - 46 %   Lymphs Abs 1.2  0.7 - 4.0 K/uL   Monocytes Relative 8  3 - 12 %   Monocytes Absolute 0.5  0.1 - 1.0 K/uL   Eosinophils Relative 1  0 - 5 %   Eosinophils Absolute 0.0  0.0 - 0.7 K/uL   Basophils Relative 0  0 - 1 %   Basophils Absolute 0.0  0.0 - 0.1 K/uL  COMPREHENSIVE METABOLIC PANEL      Status: Abnormal   Collection Time    09/08/14  6:35 PM      Result Value Ref Range   Sodium 142  137 - 147 mEq/L   Potassium 3.5 (*) 3.7 - 5.3 mEq/L   Chloride 106  96 - 112 mEq/L   CO2 26  19 - 32 mEq/L   Glucose, Bld 127 (*) 70 - 99 mg/dL   BUN 11  6 - 23 mg/dL   Creatinine, Ser 0.89  0.50 - 1.10 mg/dL   Calcium 10.1  8.4 - 10.5 mg/dL   Total Protein 6.8  6.0 - 8.3 g/dL   Albumin 3.2 (*) 3.5 - 5.2 g/dL   AST 17  0 - 37 U/L   ALT 8  0 - 35 U/L   Alkaline Phosphatase 67  39 - 117 U/L   Total Bilirubin 0.5  0.3 - 1.2 mg/dL   GFR calc non Af Amer 57 (*) >90 mL/min   GFR calc Af Amer 67 (*) >90 mL/min   Comment: (NOTE)     The eGFR has been calculated using the CKD EPI equation.     This calculation has not been validated in all clinical situations.     eGFR's persistently <90 mL/min signify possible Chronic Kidney     Disease.   Anion gap 10  5 - 15  TROPONIN I     Status: None   Collection Time    09/08/14  6:35 PM      Result Value Ref Range   Troponin I <0.30  <0.30 ng/mL   Comment:            Due to the release kinetics of cTnI,     a negative result within the first hours     of the onset of symptoms does not rule out     myocardial infarction with certainty.     If myocardial infarction is still suspected,     repeat the test at appropriate intervals.  CBG MONITORING, ED     Status: Abnormal   Collection Time    09/08/14  6:35 PM      Result Value Ref Range   Glucose-Capillary 107 (*) 70 - 99 mg/dL  URINALYSIS, ROUTINE W REFLEX MICROSCOPIC     Status: None   Collection Time    09/08/14  6:50 PM  Result Value Ref Range   Color, Urine YELLOW  YELLOW   APPearance CLEAR  CLEAR   Specific Gravity, Urine 1.011  1.005 - 1.030   pH 7.0  5.0 - 8.0   Glucose, UA NEGATIVE  NEGATIVE mg/dL   Hgb urine dipstick NEGATIVE  NEGATIVE   Bilirubin Urine NEGATIVE  NEGATIVE   Ketones, ur NEGATIVE  NEGATIVE mg/dL   Protein, ur NEGATIVE  NEGATIVE mg/dL   Urobilinogen, UA 1.0   0.0 - 1.0 mg/dL   Nitrite NEGATIVE  NEGATIVE   Leukocytes, UA NEGATIVE  NEGATIVE   Comment: MICROSCOPIC NOT DONE ON URINES WITH NEGATIVE PROTEIN, BLOOD, LEUKOCYTES, NITRITE, OR GLUCOSE <1000 mg/dL.  TROPONIN I     Status: None   Collection Time    09/09/14 12:20 AM      Result Value Ref Range   Troponin I <0.30  <0.30 ng/mL   Comment:            Due to the release kinetics of cTnI,     a negative result within the first hours     of the onset of symptoms does not rule out     myocardial infarction with certainty.     If myocardial infarction is still suspected,     repeat the test at appropriate intervals.  RETICULOCYTES     Status: Abnormal   Collection Time    09/09/14  4:20 AM      Result Value Ref Range   Retic Ct Pct 0.9  0.4 - 3.1 %   RBC. 3.40 (*) 3.87 - 5.11 MIL/uL   Retic Count, Manual 30.6  19.0 - 186.0 K/uL  TSH     Status: None   Collection Time    09/09/14  4:20 AM      Result Value Ref Range   TSH 0.800  0.350 - 4.500 uIU/mL  TROPONIN I     Status: None   Collection Time    09/09/14  4:27 AM      Result Value Ref Range   Troponin I <0.30  <0.30 ng/mL   Comment:            Due to the release kinetics of cTnI,     a negative result within the first hours     of the onset of symptoms does not rule out     myocardial infarction with certainty.     If myocardial infarction is still suspected,     repeat the test at appropriate intervals.    Dg Chest 2 View  09/08/2014   CLINICAL DATA:  Pt from home with two unwitnessed syncopal episodes. Unknown how long pt was unconscious for. Pt with no complaints or neurological deficits. Pt hypertensive with EMS and hypertensive on arrival. Hx of dementia.  EXAM: CHEST  2 VIEW  COMPARISON:  None.  FINDINGS: There is no focal parenchymal opacity, pleural effusion, or pneumothorax. There is cardiomegaly. There is thoracic aortic atherosclerosis.  There is a levoscoliosis of the thoracolumbar spine. There is severe osteoarthritis  of the left glenohumeral joint.  IMPRESSION: No active cardiopulmonary disease.   Electronically Signed   By: Kathreen Devoid   On: 09/08/2014 18:31   Ct Head Wo Contrast  09/08/2014   CLINICAL DATA:  Acute onset of syncope.  Initial encounter.  EXAM: CT HEAD WITHOUT CONTRAST  TECHNIQUE: Contiguous axial images were obtained from the base of the skull through the vertex without intravenous contrast.  COMPARISON:  None.  FINDINGS: There  is no evidence of acute infarction, mass lesion, or intra- or extra-axial hemorrhage on CT.  Prominence of the ventricles and sulci reflects mild cortical volume loss. Mild periventricular white matter change likely reflects small vessel ischemic microangiopathy. Mild cerebellar atrophy is noted.  The brainstem and fourth ventricle are within normal limits. The basal ganglia are unremarkable in appearance. The cerebral hemispheres demonstrate grossly normal gray-white differentiation. No mass effect or midline shift is seen.  There is no evidence of fracture; visualized osseous structures are unremarkable in appearance. The orbits are within normal limits. There is opacification of the visualized portions of the left maxillary sinus. The remaining paranasal sinuses and mastoid air cells are well-aerated. No significant soft tissue abnormalities are seen.  IMPRESSION: 1. No acute intracranial pathology seen on CT. 2. Mild cortical volume loss and scattered small vessel ischemic microangiopathy. 3. Near-complete opacification of the visualized portions of the left maxillary sinus.   Electronically Signed   By: Garald Balding M.D.   On: 09/08/2014 23:56    Assessment/Plan 1 syncope-very difficult situation. The patient has no recollection of her event. She has severe dementia and is alert and oriented x1. She would not be a good candidate for aggressive cardiac evaluation. Await echocardiogram for LV function. However if decreased we would not pursue further evaluation. Would  continue telemetry to watch for arrhythmias. Follow blood pressure. Would adjust regimen to allow pressure to run slightly higher in case there is an orthostatic component. Agree with further discussions with family concerning aggressiveness of care. 2 dementia-further management per primary care. 3 hypertension-follow blood pressure and adjust regimen as indicated. 4 aortic insufficiency murmur on examination-echocardiogram has been ordered. However she would not be a candidate for aggressive cardiac evaluation/therapies. 5 history of coronary artery disease based on chart-continue aspirin and statin.  Kirk Ruths MD 09/09/2014, 9:04 AM

## 2014-09-09 NOTE — Discharge Summary (Signed)
Family Medicine Teaching [facility] Discharge Summary  Patient name: Charlotte Schultz Medical record number: [id number] Date of birth: November 01, 1927 Age: 80 y.o. Gender: female Date of Admission: 09/08/2014  Date of Discharge: 09/10/2014 Admitting Physician: Tobey Grim, MD  Primary Care Provider: No primary provider on file. Consultants: Cardiology  Indication for Hospitalization: Syncope  Discharge Diagnoses/Problem List:  Patient Active Problem List   Diagnosis Date Noted  . Syncope and collapse 09/08/2014   Disposition: Discharge Home with [facility]  Discharge Condition: Stable  Discharge Exam:  General: alert, well-developed, NAD, cooperative. Lungs: CTAB, normal respiratory effort, no accessory muscle use, no crackles, and no wheezes.  Heart: RRR, 3/6 blowing systolic murmur.  Abdomen: Bowel sounds normal; abdomen soft and nontender.  Msk: no joint swelling, no joint warmth, and no redness over joints.  Extremities: No cyanosis, clubbing, edema. Pressure soars on bilateral feet. Warm and dry Neurologic: No focal deficits. Alert and oriented x1.   Brief Hospital Course:  Charlotte Schultz is a 80 y.o. female who presented with 2 syncopal episodes . PMH is significant for HTN, HLD, CAD, dementia, and history of MI.   Her hospital course by problem is listed below:  #Syncope - Patient had two witnessed syncopal episodes. Unclear etiology at this time. DDx included neurocardiogenic(per history does not appear situational or positional in nature; adjusted BP medications in case patient was getting hypotensive. Negative orthostatics.) vs cardiovascular(EKG showed NSR with some T wave abnormalities. Patient was monitored on telemetry and no arrhythmias noted just PVCs and NSR. Troponins cycled and were negative. Patient does have murmur on exam, echo performed showed normal systolic function with mitral and atrial regurgitation otherwise was unremarkable) vs neurologic(does not  appear to be seizure-like activity and patient has no history of seizures; also normal neuro exam excluding known dementia. EEG performed with no seizure activity noted. CT with no acute processes). Patient with no current evidence of dehydration or infection. UA was negative. CXR showed no active cardiopulmonary disease. We were unable to determine a cause for the syncopal episodes at this time. Patient had no events in the hospital. Will need outpatient follow-up.  #HTN - BPs very labile with wide pulse pressure. Decided to continue home Coreg and Lisinopril and withheld Norvasc and hydralazine at this time.  #HLD -Continued home Lipitor.  #CAD - Hx of MI about 10 years ago. Continued home aspirin, statin, and BP meds. #Dementia - Patient appears severely demented. She failed the MMSE x2. Only oriented to self. Not on any medications for her dementia but unlikely to receive any benefit since it appears to be advanced. Patient gets assistance with daily ADLs. CT heads shows mild cortical volume loss and scattered small vessel ischemic microangiopathy.    Issues for Follow Up:  1. GOC discussion with family.  2. Monitor BPs 3. Follow-up dementia. Needs formal work-up.   Significant Procedures: EEG, 2D Echo  Significant Labs and Imaging:   Recent Labs Lab 09/08/14 1835  WBC 6.2  HGB 11.1*  HCT 32.9*  PLT 232    Recent Labs Lab 09/08/14 1835  NA 142  K 3.5*  CL 106  CO2 26  GLUCOSE 127*  BUN 11  CREATININE 0.89  CALCIUM 10.1  ALKPHOS 67  AST 17  ALT 8  ALBUMIN 3.2*   Iron/TIBC/Ferritin/ %Sat    Component Value Date/Time   IRON 34* 09/09/2014 0420   TIBC 173* 09/09/2014 0420   FERRITIN 82 09/09/2014 0420   IRONPCTSAT 20 09/09/2014 0420   Trop  neg x3 A1c 5.5  Dg Chest 2 View  09/08/2014 IMPRESSION: No active cardiopulmonary disease.   Ct Head Wo Contrast  09/08/2014 IMPRESSION: 1. No acute intracranial pathology seen on CT. 2. Mild cortical volume loss and scattered  small vessel ischemic microangiopathy. 3. Near-complete opacification of the visualized portions of the left maxillary sinus.    Results/Tests Pending at Time of Discharge: None  Discharge Medications:    Medication List    STOP taking these medications       amLODipine 5 MG tablet  Commonly known as:  NORVASC     hydrALAZINE 25 MG tablet  Commonly known as:  APRESOLINE      TAKE these medications       aspirin 81 MG tablet  Take 81 mg by mouth daily.     atorvastatin 80 MG tablet  Commonly known as:  LIPITOR  Take 80 mg by mouth daily.     carvedilol 12.5 MG tablet  Commonly known as:  COREG  Take 12.5 mg by mouth every 12 (twelve) hours.     feeding supplement (ENSURE COMPLETE) Liqd  Take 237 mLs by mouth 2 (two) times daily between meals.     lisinopril 40 MG tablet  Commonly known as:  PRINIVIL,ZESTRIL  Take 40 mg by mouth daily.        Discharge Instructions: Please refer to Patient Instructions section of EMR for full details.  Patient was counseled important signs and symptoms that should prompt return to medical care, changes in medications, dietary instructions, activity restrictions, and follow up appointments.   Follow-Up Appointments: Follow-up Information   Follow up with [facility]. (home health nurse and social worker)    Contact information:   [address] [place] [id number] [phone]     -Patient's family given resources for PCP follow-up.  Kathee DeltonIan D Varetta Chavers, MD 09/10/2014, 2:39 PM PGY-1, [facility]

## 2014-09-09 NOTE — Progress Notes (Signed)
  Echocardiogram 2D Echocardiogram has been performed.  Marlys Stegmaier 09/09/2014, 11:13 AM

## 2014-09-09 NOTE — Progress Notes (Signed)
UR completed 

## 2014-09-09 NOTE — Progress Notes (Addendum)
PT Cancellation Note  Patient Details Name: Charlotte Schultz MRN: [id number] DOB: 1927/05/22   Cancelled Treatment:    Reason Eval/Treat Not Completed:  (In am, pt in radiology and in pm, at EEG. )   Coy Rochford F 09/09/2014, 4:19 PM  [name],PT Acute Rehabilitation [phone] (pager)

## 2014-09-09 NOTE — Progress Notes (Signed)
09/09/2014 11:36 PM Progress Notes The patient's son and his wife said that the daughter-in-law stays with the patient all day at home to take care of her.  The son and daughter-in-law said they might be interested in home health to assist them with the patient's care.  I will ask the nurse on the next shift to see if social work can talk with the family about care options. Harriet Masson

## 2014-09-10 DIAGNOSIS — R55 Syncope and collapse: Secondary | ICD-10-CM | POA: Diagnosis not present

## 2014-09-10 MED ORDER — ENSURE COMPLETE PO LIQD: 237.0000 mL | Freq: Two times a day (BID) | ORAL | Status: AC

## 2014-09-10 NOTE — Discharge Instructions (Signed)
Discharge Date: 09/09/2014  Reason for Hospitalization: Loss of consciousness  -We were unable to identify a cause for your syncopal episodes. All major causes were ruled out.  Many test and procedures were performed that were normal. -These episodes may occur again therefore patient needs 24 hour supervision and close follow-up.  -We are stopping some of your blood pressure medications please review new medication list. -An order has been placed for Home Health services including PT and and aide. -It is important for patient to follow with a PCP. Below is a Facilities managerresource guide of doctors in the area.  Thank you for letting us participate in your care!    Emergency Department Resource Guide 1) Find a Doctor and Pay Out of Pocket Although you won't have to find out who is covered by your insurance plan, it is a good idea to ask around and get recommendations. You will then need to call the office and see if the doctor you have chosen will accept you as a new patient and what types of options they offer for patients who are self-pay. Some doctors offer discounts or will set up payment plans for their patients who do not have insurance, but you will need to ask so you aren't surprised when you get to your appointment.  2) Contact Your Local Health Department Not all health departments have doctors that can see patients for sick visits, but many do, so it is worth a call to see if yours does. If you don't know where your local health department is, you can check in your phone book. The CDC also has a tool to help you locate your state's health department, and many state websites also have listings of all of their local health departments.  3) Find a Walk-in Clinic If your illness is not likely to be very severe or complicated, you may want to try a walk in clinic. These are popping up all over the country in pharmacies, drugstores, and shopping centers. They're usually staffed by nurse practitioners or  physician assistants that have been trained to treat common illnesses and complaints. They're usually fairly quick and inexpensive. However, if you have serious medical issues or chronic medical problems, these are probably not your best option.  No Primary Care Doctor: - Call Health Connect at  [phone] - they can help you locate a primary care doctor that  accepts your insurance, provides certain services, etc. - Physician Referral Service- [phone]  Chronic Pain Problems: Organization         Address  Phone   Notes  [facility]  [phone] Patients need to be referred by their primary care doctor.   Medication Assistance: Organization         Address  Phone   Notes  [facility] [address] [place], [place] [phone] --Must be a resident of [facility] [place] -- Must have NO insurance coverage whatsoever (no Medicaid/ Medicare, etc.) -- The pt. MUST have a primary care doctor that directs their care regularly and follows them in the community   Med[facility]ist  [phone]   [employer]  [phone]    Agencies that provide inexpensive medical care: Organization         Address  Phone   Notes  [facility]  [phone]   [facility]    [phone]   [facility] [address] [place],  [place] [address] [phone]   [facility] [address], [place] [phone]   [facility]anned Parenthood    [phone]   [facility]    [phone]   [facility]  [address], [place] Phone:  [phone], Fax:  [phone] Hours of Operation:  9 am - 6 pm, M-F.  Also accepts Medicaid/Medicare and self-pay.  [facility]  [address], [place] Phone: [phone], Fax: [phone]. Hours of Operation:  8:30 am - 5:30 pm, M-F.   Also accepts Medicaid and self-pay.  [facility] [place] [address] [place] Phone: [phone]   [facility] [address] Natasha Bence [place], [place] [phone], Ext. [phone] Mondays & Thursdays: 7-9 AM.  First 15 patients are seen on a first come, first serve basis.    Medicaid-accepting [facility] Providers:  Organization         Address  Phone   Notes  [facility] [address], [place] [phone] Also accepts self-pay patients.  [facility] [address] Laurell Josephs [place], [place]  [phone]   [facility] [address], [place] [phone]   [facility] [address], [place] [phone]   Renaye Rakers [address], [place]   [phone] Only accepts [place] Access [place] patients after they have their name applied to their card.   Self-Pay (no insurance) in [facility]:  Organization         Address  Phone   Notes  Sickle Cell Patients, [facility] [address] [place], [place] [phone]   [facility] [address] [place], [place] [phone]   [facility]  [address], [place] [phone]   [facility]/Dr. Osei-Bonsu  [address], [place] or [address], [place] [phone] Phone number for both [place] and [place] locations is the same.  [facility] [address], [place] [phone]   [facility] [address], [place] or [address] [phone]   [facility] [address] [place] [phone], phone; [phone], fax Sees patients 1st and 3rd Saturday of every month.  Must not qualify for public or private insurance (i.e. Medicaid, Medicare, [place] Health Choice, Veterans'  Benefits)  Household income should be no more than 200% of the poverty level The clinic cannot treat you if you are pregnant or think you are pregnant  Sexually transmitted diseases are not treated at the clinic.    Dental Care: Organization         Address  Phone  Notes  [facility] [address] [place], [place] [phone] Accepts children up to age 69 who are enrolled in [place] or [place] Health Choice; pregnant women with a Medicaid card; and children who have applied for Medicaid or [place] Health Choice, but were declined, whose parents can pay a reduced fee at time of service.  [facility] of [facility]  [address], [place] [phone] Accepts children up to age 63 who are enrolled in [place] or [place] Health Choice; pregnant  women with a Medicaid card; and children who have applied for Medicaid or [place] Health Choice, but were declined, whose parents can pay a reduced fee at time of service.  [facility]  [address] [place], [place] [phone] Patients are seen by appointment only. Walk-ins are not accepted. [facility] will see patients 5 years of age and older. Monday - Tuesday (8am-5pm) Most Wednesdays (8:30-5pm) $30 per visit, cash only  [facility] Access [facility]  [address], [facility] [phone] Patients are seen by appointment only. Walk-ins are not accepted. [facility] will see patients 44 years of age and older. One Wednesday Evening (Monthly: Volunteer Based).  $30 per visit, cash only  [employer]  [phone] for adults; Children under age 109, call Graduate Pediatric Dentistry at [phone]. Children aged 63-14, please call [phone] to request a pediatric application.  Dental services are provided in all areas of dental care including fillings, crowns and bridges, complete and partial  dentures, implants, gum treatment, root canals, and extractions. Preventive care is also provided. Treatment is provided to both adults and children. Patients are selected via a lottery and there is often a waiting list.   [facility] [address], [place]  [phone] [email or web address]   [facility] [address] [place], [place] [phone], Ext. [phone] Second and Fourth Thursday of each month, opens at 6:30 AM; Clinic ends at 9 AM.  Patients are seen on a first-come first-served basis, and a limited number are seen during each clinic.   [facility]  [address] Ether Griffins [place], [place] [phone]   Eligibility Requirements You must have lived in [place], [place], or [place] counties for at least the last three months.   You cannot be eligible for state or federal sponsored [place], including [employer], [place], or [employer].   You generally cannot be eligible for healthcare insurance through your employer.    How to apply: Eligibility screenings are held every Tuesday and Wednesday afternoon from 1:00 pm until 4:00 pm. You do not need an appointment for the interview!  [facility] [address], [place], [place] [phone]   [facility] Health Department  [phone]   [facility] Health Department  [phone]   [facility] Health Department  [phone]    Behavioral Health Resources in the Community: Intensive Outpatient Programs Organization         Address  Phone  Notes  [facility] [address] [place], [place] [phone]   [facility] Outpatient [address] [place], [place] [phone]   [facility] [address] [place], [place]  [phone]   [facility] [address]  [place], [place] [phone] or [phone]   Substance Abuse Resources Organization          Address  Phone  Notes  [facility] Drug [facility]  [phone]   [facility]  [phone]   [facility]  [phone]   Floydene Flock  [phone]   Residential & Outpatient Substance Abuse Program  [phone]   Psychological Services Organization         Address  Phone  Notes  [facility]  [phone]   [facility]  [phone]   [facility] [address] [place] [phone] or [phone]    Mobile Crisis  Teams Organization         Address  Phone  Notes  [facility], Mobile Crisis Care Unit  [phone]   [facility]  [address] [place], [place] [phone]   [facility] [address] [place] [phone]    Self-Help/Support Groups Organization         Address  Phone             Notes  [facility] [place] - variety of support groups  [phone]- [phone] Call for more information  Narcotics Anonymous (NA), [facility] [address], [employer] [place]  2 meetings at this location   Statistician         Address  Phone  Notes  [facility] [address],    [place]  [phone]   [facility]  [address], [place] [address], [place], [place] [phone]   [facility] [address] [place], [place] [phone] Admissions: 8am-3pm M-F  [facility] [address],    [place], [place] [phone]   [facility] [address] [place], [place], [place] [phone]   [facility] [address],  [place], [place] [phone]   [facility] - Intensive Outpatient [address] Laurell Josephs [address], [place], [place] [phone]   [facility] ([facility] [address] [place]  [place], [place] [phone] or [phone]   [facility] ([facility]) [address] [place], [place]  [phone] Accepts Medicaid  [facility] [address],  [place] [phone] Substance Abuse/Addiction Treatment   [facility] Organization         Address  Phone  Notes  [facility]  [phone]   Angie Fava, PhD [address] Ervin Knack [place], [place]   [phone] or [phone]   [facility]   [address] [place], [place] [phone]   [facility] [address] [place], [place] [phone] Insurance/Medicaid/sponsorship through [facility] and Families [address]                                    [place], [place] [phone] Therapy/tele-psych/case  [facility] [address] [place] [address], [place] [phone]    Dr. Lolly Mustache  [phone]   [facility]  [employer] [facility] Dept. 1) [address] [place] 2) [address], [place] 3)  [address], [place] [phone]  [phone]   [facility] Child Abuse Hotline 782-142-0041 or [phone] (After Hours)

## 2014-09-10 NOTE — Progress Notes (Signed)
Reviewed dc instructions with patient and family that cares for her at home. Per family they will follow up with the patient regular doctor, as prior to admission, they have been trying to get her setup with [facility]. Pt's family given brochure on community wellness per Case Manager Egbert Garibaldi A

## 2014-09-10 NOTE — Progress Notes (Signed)
Utilization Review completed.  

## 2014-09-10 NOTE — Progress Notes (Signed)
09/10/2014 5:29 AM Progress Notes Bladder scan showed 249 cc in the bladder. When tried to void, the patient was unable.  Will try to urinate again before shift ends.  Will continue to monitor. Harriet Masson

## 2014-09-10 NOTE — Evaluation (Signed)
Physical Therapy Evaluation Patient Details Name: Tyler Deislizabeth Gest MRN: [id number] DOB: 20-Jul-1927 Today's Date: 09/10/2014   History of Present Illness  Tyler Deislizabeth Hoeg is a 80 y.o. female presenting with 2 syncopal episodes . PMH is significant for HTN, HLD, CAD, dementia, and history of MI  Clinical Impression  Patient did well with overall mobility - slightly unsteady during gait requiring assistance.  Feel patient could benefit from use of RW or cane, if she would use (although with her dementia I doubt this).  Patient essentially at baseline.  PT will sign off.     Follow Up Recommendations No PT follow up    Equipment Recommendations  None recommended by PT    Recommendations for Other Services       Precautions / Restrictions Precautions Precautions: Fall Restrictions Weight Bearing Restrictions: No      Mobility  Bed Mobility Overal bed mobility: Modified Independent             General bed mobility comments: used railing  Transfers Overall transfer level: Needs assistance   Transfers: Sit to/from Stand Sit to Stand: Min guard         General transfer comment: required a few tries to get to standing  Ambulation/Gait Ambulation/Gait assistance: Min guard Ambulation Distance (Feet): 100 Feet Assistive device: None Gait Pattern/deviations: WFL(Within Functional Limits)        Stairs            Wheelchair Mobility    Modified Rankin (Stroke Patients Only)       Balance Overall balance assessment: Needs assistance Sitting-balance support: No upper extremity supported Sitting balance-Leahy Scale: Good     Standing balance support: No upper extremity supported Standing balance-Leahy Scale: Fair                               Pertinent Vitals/Pain Pain Assessment: No/denies pain    Home Living                        Prior Function                 Hand Dominance        Extremity/Trunk  Assessment               Lower Extremity Assessment: Overall WFL for tasks assessed         Communication      Cognition Arousal/Alertness: Awake/alert Behavior During Therapy: WFL for tasks assessed/performed Overall Cognitive Status: History of cognitive impairments - at baseline                      General Comments      Exercises        Assessment/Plan    PT Assessment Patent does not need any further PT services  PT Diagnosis Generalized weakness   PT Problem List    PT Treatment Interventions     PT Goals (Current goals can be found in the Care Plan section) Acute Rehab PT Goals PT Goal Formulation: No goals set, d/c therapy    Frequency     Barriers to discharge        Co-evaluation               End of Session   Activity Tolerance: Patient tolerated treatment well Patient left: in bed;with bed alarm set;with call bell/phone within reach  Time: 8676-7209 PT Time Calculation (min): 12 min   Charges:   PT Evaluation $Initial PT Evaluation Tier I: 1 Procedure     PT G CodesOlivia Canter, [place] [phone] 09/10/2014, 12:00 PM

## 2014-09-10 NOTE — Progress Notes (Signed)
Subjective:   No further LOC. Denies CP or SOB.   ECHO EF 50-55% Moderate AI. Mild MR. Normal LV size. No regional wall motion abnormality. RV ok.   Resting EEG: no seizure activity  Head CT with sinus disease. No active process.   Tele: SR with rare PVC    No intake or output data in the 24 hours ending 09/10/14 1120  Current meds: . aspirin  81 mg Oral Daily  . atorvastatin  80 mg Oral Daily  . carvedilol  12.5 mg Oral BID WC  . feeding supplement (ENSURE COMPLETE)  237 mL Oral BID BM  . heparin subcutaneous  5,000 Units Subcutaneous 3 times per day  . lisinopril  40 mg Oral Daily  . sodium chloride  3 mL Intravenous Q12H  . sodium chloride  3 mL Intravenous Q12H   Infusions:     Objective:  Blood pressure 153/42, pulse 69, temperature 98.6 F (37 C), temperature source Oral, resp. rate 18, height 5\' 6"  (1.676 m), weight 54.522 kg (120 lb 3.2 oz), SpO2 100.00%. Weight change:   Physical Exam: General: Elderly in NAD.  Skin warm/dry  No peripheral clubbing  Back-normal  HEENT-normal/normal eyelids  Neck supple/normal carotid upstroke bilaterally; no bruits; no JVD; no thyromegaly  chest - CTA/ normal expansion  CV - RRR/2/6 diastolic murmur left sternal border. 2/6 systolic murmur apex. No S3 or S4.  Abdomen -NT/ND, no HSM, no mass, + bowel sounds, no bruit  2+ femoral pulses, no bruits  Ext-no edema, chords; Diminished distal pulses  Neuro-Patient moves all extremities. Severe dementia. Alert and oriented x1.        Lab Results: Basic Metabolic Panel:  Recent Labs Lab 09/08/14 1835  NA 142  K 3.5*  CL 106  CO2 26  GLUCOSE 127*  BUN 11  CREATININE 0.89  CALCIUM 10.1   Liver Function Tests:  Recent Labs Lab 09/08/14 1835  AST 17  ALT 8  ALKPHOS 67  BILITOT 0.5  PROT 6.8  ALBUMIN 3.2*   No results found for this basename: LIPASE, AMYLASE,  in the last 168 hours No results found for this basename: AMMONIA,  in the last 168  hours CBC:  Recent Labs Lab 09/08/14 1835  WBC 6.2  NEUTROABS 4.5  HGB 11.1*  HCT 32.9*  MCV 81.8  PLT 232   Cardiac Enzymes:  Recent Labs Lab 09/08/14 1835 09/09/14 0020 09/09/14 0427 09/09/14 1140  TROPONINI <0.30 <0.30 <0.30 <0.30   BNP: No components found with this basename: POCBNP,  CBG:  Recent Labs Lab 09/08/14 1835  GLUCAP 107*   Microbiology: No results found for this basename: cult   No results found for this basename: CULT, SDES,  in the last 168 hours  Imaging: Dg Chest 2 View  09/08/2014   CLINICAL DATA:  Pt from home with two unwitnessed syncopal episodes. Unknown how long pt was unconscious for. Pt with no complaints or neurological deficits. Pt hypertensive with EMS and hypertensive on arrival. Hx of dementia.  EXAM: CHEST  2 VIEW  COMPARISON:  None.  FINDINGS: There is no focal parenchymal opacity, pleural effusion, or pneumothorax. There is cardiomegaly. There is thoracic aortic atherosclerosis.  There is a levoscoliosis of the thoracolumbar spine. There is severe osteoarthritis of the left glenohumeral joint.  IMPRESSION: No active cardiopulmonary disease.   Electronically Signed   By: Elige KoHetal  Patel   On: 09/08/2014 18:31   Ct Head Wo Contrast  09/08/2014   CLINICAL DATA:  Acute onset of syncope.  Initial encounter.  EXAM: CT HEAD WITHOUT CONTRAST  TECHNIQUE: Contiguous axial images were obtained from the base of the skull through the vertex without intravenous contrast.  COMPARISON:  None.  FINDINGS: There is no evidence of acute infarction, mass lesion, or intra- or extra-axial hemorrhage on CT.  Prominence of the ventricles and sulci reflects mild cortical volume loss. Mild periventricular white matter change likely reflects small vessel ischemic microangiopathy. Mild cerebellar atrophy is noted.  The brainstem and fourth ventricle are within normal limits. The basal ganglia are unremarkable in appearance. The cerebral hemispheres demonstrate grossly  normal gray-white differentiation. No mass effect or midline shift is seen.  There is no evidence of fracture; visualized osseous structures are unremarkable in appearance. The orbits are within normal limits. There is opacification of the visualized portions of the left maxillary sinus. The remaining paranasal sinuses and mastoid air cells are well-aerated. No significant soft tissue abnormalities are seen.  IMPRESSION: 1. No acute intracranial pathology seen on CT. 2. Mild cortical volume loss and scattered small vessel ischemic microangiopathy. 3. Near-complete opacification of the visualized portions of the left maxillary sinus.   Electronically Signed   By: Roanna Raider M.D.   On: 09/08/2014 23:56     ASSESSMENT:  1. Syncope 2. Dementia 3. HTN  4. Moderate aortic valve insufficiency  PLAN/DISCUSSION:  Cardiac work-up un impressive so far. Given dementia not candidate for any invasive w/u and at this point I do not think she needs it. Aortic valve insufficiency is moderate and LV is normal so this is not a major issue. No tachy or brady events on tele.   Can consider outpatient event monitor.  If family wants to pursue this please contact us prior to d/c and we will arrange outpatient event monitor. Otherwise we will sign off at this time.    LOS: 2 days    Arvilla Meres, MD 09/10/2014, 11:20 AM

## 2014-09-10 NOTE — Care Management Note (Addendum)
    Page 1 of 2   09/10/2014     2:28:52 PM CARE MANAGEMENT NOTE 09/10/2014  Patient:  MALEIGHA, COLVARD   Account Number:  [id number]  Date Initiated:  09/10/2014  Documentation initiated by:  [facility]  Subjective/Objective Assessment:   adm: Syncope and collapse     Action/Plan:   discharge planning   Anticipated DC Date:  09/10/2014   Anticipated DC Plan:  [place]  CM consult      Choice offered to / List presented to:          [facility] arranged  HH-1 RN  [place]      [place].   Status of service:  Completed, signed off Medicare Important Message given?   (If response is "NO", the following Medicare IM given date fields will be blank) Date Medicare IM given:   Medicare IM given by:   Date Additional Medicare IM given:   Additional Medicare IM given by:    Discharge Disposition:  [place]  Per UR Regulation:    If discussed at Long Length of Stay Meetings, dates discussed:    Comments:  09/10/14 14:00 CM met with pt in room but pt is not very communicative.  CM called son, Jalexus Brett [phone]. Reybrn states pt lives with their family.  When CM explained I was leaving a [facility] pamphlet for him to secure a PCP, Insurance, and follow up care,Reybrn stated his mother HAS insurance through his Dad-deceased but retired Nature conservation officer.  CM asked for Insurance but Reybrn stated he would like a CSW to come to the house to sort this all out. Reybrn states family not sure of next move in the care of his mother and is appreciative of any [place] services we could provide to help their family.  Referral called to [facility] with Reybrn's contact information.  No insruance to cover shower seat.  No other CM needs were communicated.  Mariane Masters, BSN, CM [phone].

## 2014-09-12 ENCOUNTER — Observation Stay: Payer: Self-pay | Admitting: Internal Medicine

## 2014-09-12 ENCOUNTER — Other Ambulatory Visit: Payer: Self-pay | Admitting: Nurse Practitioner

## 2014-09-12 ENCOUNTER — Encounter: Payer: Self-pay | Admitting: Nurse Practitioner

## 2014-09-12 DIAGNOSIS — I10 Essential (primary) hypertension: Secondary | ICD-10-CM

## 2014-09-12 DIAGNOSIS — R7989 Other specified abnormal findings of blood chemistry: Secondary | ICD-10-CM

## 2014-09-12 LAB — CBC WITH DIFFERENTIAL/PLATELET
Basophil #: 0 10*3/uL (ref 0.0–0.1)
Basophil %: 0.6 %
EOS PCT: 1.8 %
Eosinophil #: 0.1 10*3/uL (ref 0.0–0.7)
HCT: 34.8 % — AB (ref 35.0–47.0)
HGB: 11.4 g/dL — ABNORMAL LOW (ref 12.0–16.0)
LYMPHS ABS: 2.2 10*3/uL (ref 1.0–3.6)
Lymphocyte %: 27.8 %
MCH: 28.1 pg (ref 26.0–34.0)
MCHC: 32.9 g/dL (ref 32.0–36.0)
MCV: 85 fL (ref 80–100)
MONO ABS: 0.7 x10 3/mm (ref 0.2–0.9)
MONOS PCT: 9.6 %
NEUTROS PCT: 60.2 %
Neutrophil #: 4.6 10*3/uL (ref 1.4–6.5)
Platelet: 227 10*3/uL (ref 150–440)
RBC: 4.07 10*6/uL (ref 3.80–5.20)
RDW: 15 % — AB (ref 11.5–14.5)
WBC: 7.7 10*3/uL (ref 3.6–11.0)

## 2014-09-12 LAB — URINALYSIS, COMPLETE
BLOOD: NEGATIVE
Bilirubin,UR: NEGATIVE
Glucose,UR: NEGATIVE mg/dL (ref 0–75)
Ketone: NEGATIVE
Leukocyte Esterase: NEGATIVE
NITRITE: NEGATIVE
PROTEIN: NEGATIVE
Ph: 6 (ref 4.5–8.0)
RBC,UR: 3 /HPF (ref 0–5)
Specific Gravity: 1.011 (ref 1.003–1.030)
Squamous Epithelial: 1
WBC UR: 2 /HPF (ref 0–5)

## 2014-09-12 LAB — BASIC METABOLIC PANEL
Anion Gap: 5 — ABNORMAL LOW (ref 7–16)
BUN: 8 mg/dL (ref 7–18)
CALCIUM: 9.4 mg/dL (ref 8.5–10.1)
CO2: 24 mmol/L (ref 21–32)
CREATININE: 0.89 mg/dL (ref 0.60–1.30)
Chloride: 116 mmol/L — ABNORMAL HIGH (ref 98–107)
EGFR (African American): >60
EGFR (Non-African Amer.): >60
Glucose: 92 mg/dL (ref 65–99)
OSMOLALITY: 287 (ref 275–301)
POTASSIUM: 3.7 mmol/L (ref 3.5–5.1)
SODIUM: 145 mmol/L (ref 136–145)

## 2014-09-12 LAB — CK-MB: CK-MB: 0.6 ng/mL (ref 0.5–3.6)

## 2014-09-12 LAB — TROPONIN I
TROPONIN-I: 0.51 ng/mL — AB
TROPONIN-I: 0.54 ng/mL — AB
Troponin-I: 0.5 ng/mL — ABNORMAL HIGH

## 2014-09-12 LAB — CK: CK, Total: 43 U/L

## 2014-09-12 NOTE — Progress Notes (Signed)
PT evaluation addendum Late entry for 2014/09/19 - gcodes   09-19-14 1159  PT G-Codes **NOT FOR INPATIENT CLASS**  Functional Assessment Tool Used clinical judgement  Functional Limitation Mobility: Walking and moving around  Mobility: Walking and Moving Around Current Status [phone]) CI  Mobility: Walking and Moving Around Goal Status [phone]) CI  Mobility: Walking and Moving Around Discharge Status [phone]) CI  09/12/2014 Corlis Hove, PT [phone]

## 2014-09-13 ENCOUNTER — Telehealth: Payer: Self-pay

## 2014-09-13 LAB — BASIC METABOLIC PANEL
Anion Gap: 7 (ref 7–16)
BUN: 8 mg/dL (ref 7–18)
CALCIUM: 8.9 mg/dL (ref 8.5–10.1)
CHLORIDE: 113 mmol/L — AB (ref 98–107)
CO2: 25 mmol/L (ref 21–32)
Creatinine: 0.81 mg/dL (ref 0.60–1.30)
EGFR (African American): >60
EGFR (Non-African Amer.): >60
GLUCOSE: 87 mg/dL (ref 65–99)
Osmolality: 286 (ref 275–301)
Potassium: 3.6 mmol/L (ref 3.5–5.1)
Sodium: 145 mmol/L (ref 136–145)

## 2014-09-13 NOTE — Telephone Encounter (Signed)
Attempted to contact pt regarding discharge from [facility] on 09/13/14. Left detailed message asking pt to call back w/ any questions about her medications or discharge instructions. Advised her that she has an appt w/ Eula Listen, PA on 09/20/14 @ 1:45.

## 2014-09-14 LAB — URINE CULTURE

## 2014-09-19 NOTE — Progress Notes (Signed)
Patient Name: Charlotte Schultz, DOB 16-Jan-1927, MRN [id number]  Date of Encounter: 09/21/2014  Primary Care Provider:  No primary provider on file. Primary Cardiologist:  Dr. Eden EmmsNishan, MD  Patient Profile:  80 y.o. female with history below who presents for hospital follow up after recent admission [facility]10/10 for multiple witnessed syncopal episodes.    Problem List:   Past Medical History  Diagnosis Date  . Hypertension   . Infectious colitis   . HLD (hyperlipidemia)   . Hypertensive cardiomyopathy     a. 09/2014 Echo: EF 50-55%, sev LVH, mod AI, mild MR.  . Ovarian cyst   . CAD (coronary artery disease)     a. unclear hx.  . Dementia   . Moderate aortic insufficiency     a. 09/2014 Echo: mod AI.  Marland Kitchen. Syncope     a. 09/2014 - normal EEG, nl LV fxn by echo, neg Heat CT.  Marland Kitchen. LVH (left ventricular hypertrophy) due to hypertensive disease    History reviewed. No pertinent past surgical history.   Allergies:  Allergies  Allergen Reactions  . Zetia [Ezetimibe]     unknown  . Aspirin Rash     HPI:  80 y.o. female with the above problem list who is here for hospital follow up after recent [facility]10[facility]10 for multiple syncopal episodes.   Patient with history of CAD s/p MI about 10 years ago - no record of cardiac evaluation or treatment since. She apparently has had syncopal episodes previously without clear etiology being demonstrated. When her syncope has happened in the past she has been found to be dehydrated or have an infection. She would also have an episode of emesis which did not happen this time.   [facility] after leaving her car and going towards her apartment with her daughter-in-law when she passed out. She came to a couple of minutes later, then again had another LOC. She did not hit her head or lose function of her bowel or bladder. No reported seizure like activity. Patient was in her usually state of health  prior to the syncopal episode. Head CT without acute intracranial pathy - near complete opacification of the left maxillary sinus, CXR no active cardiopulmonary disease, TnI negative x 4, glucose 127, K+ 3.5, hgb 11.1, EKG, NSR, 83, LVH with repolarization abnormality, left axis deviation, cannot rule out prior septal infarct, left atrial enlargement. Echo showed EF 50-55%, no regional WMA, moderate aortic regurgitation, mild MR, no patent foramen ovale, peak PA pressure 38 mm Hg. Tele NSR with rare PVC. Resting EEG no seizure activity. Plan was pursue outpatient cardiac event monitor if family wished.   Today she states she is "fine." However, per her daughter's report this is what she states everytime she goes to the MD office 2/2 being afraid of being admitted to the hospital. Her BP has been labile at home ranging from the 180/60 - normal - diastolic in the 40's. Her last admission at [facility] she presented with SBP in the 200s. She does not eat well at all except for sweets. She has not noticed any palpitations however the family would like to proceed with outpatient cardiac monitoring. No further syncopal events.    Home Medications:  Prior to Admission medications   Medication Sig Start Date End Date Taking? Authorizing Provider  aspirin 81 MG tablet Take 81 mg by mouth daily.    Historical Provider, MD  atorvastatin (LIPITOR) 80 MG  tablet Take 80 mg by mouth daily.    Historical Provider, MD  carvedilol (COREG) 12.5 MG tablet Take 12.5 mg by mouth every 12 (twelve) hours.    Historical Provider, MD  feeding supplement, ENSURE COMPLETE, (ENSURE COMPLETE) LIQD Take 237 mLs by mouth 2 (two) times daily between meals. 09/10/14   Pincus Large, DO  lisinopril (PRINIVIL,ZESTRIL) 40 MG tablet Take 40 mg by mouth daily.    Historical Provider, MD     Weights: Filed Weights   09/20/14 1322  Weight: 117 lb 4 oz (53.184 kg)     Review of Systems:  All other systems reviewed and are otherwise  negative except as noted above.  Physical Exam:  Blood pressure 150/60, pulse 94, height 5\' 6"  (1.676 m), weight 117 lb 4 oz (53.184 kg).  General: Pleasant, NAD Psych: Normal affect. Neuro: Alert and oriented X 3. Moves all extremities spontaneously. HEENT: Normal  Neck: Supple without bruits or JVD. Lungs:  Resp regular and unlabored, CTA. Heart: RRR no s3, s4, or 2/6 systolic murmur apex. Abdomen: Soft, non-tender, non-distended, BS + x 4.  Extremities: No clubbing, cyanosis or edema. DP/PT/Radials 2+ and equal bilaterally.   Accessory Clinical Findings:  EKG - NSR, 94, LVH, TWI III, aVF, V5-V6  Assessment & Plan:  1. Syncope: -Cardiac work-up unimpressive to this point.  -Not a candidate for any invasive work-up 2/2 extensive dementia (aortic valve insufficiency is moderate & LV function is normal) -No tachy or brady events seen on inpatient telemetry  -Family would like to pursue outpatient cardiac event monitoring - 14 day monitor ordered  2. CAD s/p MI approximately 10 years ago: -Given her baseline dementia will pursue optimal medical therapy  -Continue aspirin 81 mg, Lipitor 80 mg  3. HTN: -Blood pressures seem to be quite labile at home ranging from the 180's/60's to normal to diastolic of 40's per patient's daughter report -It was explained that she would need to have some slightly higher BP given her history of passing out - however, she did present to [facility] recently with significantly elevated BP 211/44. Her antihypertensives were restarted at that time.  -I have increased her amlodipine to 10 mg daily and discussed with the patient as well as the daughter the importance of eating.   4. HLD: -Continue Lipitor 80 mg  5. Dementia: -Advanced - requires assistance with ADLs -Follow up with PCP   Eula Listen, PA-C [facility] [address] [place], [place] [address] [phone] [facility] 09/21/2014, 1:01 PM

## 2014-09-20 ENCOUNTER — Ambulatory Visit (INDEPENDENT_AMBULATORY_CARE_PROVIDER_SITE_OTHER): Payer: Self-pay | Admitting: Physician Assistant

## 2014-09-20 ENCOUNTER — Encounter: Payer: Self-pay | Admitting: Physician Assistant

## 2014-09-20 VITALS — BP 150/60 | HR 94 | Ht 66.0 in | Wt 117.2 lb

## 2014-09-20 DIAGNOSIS — R55 Syncope and collapse: Secondary | ICD-10-CM

## 2014-09-20 DIAGNOSIS — I2510 Atherosclerotic heart disease of native coronary artery without angina pectoris: Secondary | ICD-10-CM

## 2014-09-20 DIAGNOSIS — R627 Adult failure to thrive: Secondary | ICD-10-CM

## 2014-09-20 DIAGNOSIS — F0390 Unspecified dementia without behavioral disturbance: Secondary | ICD-10-CM

## 2014-09-20 DIAGNOSIS — I10 Essential (primary) hypertension: Secondary | ICD-10-CM

## 2014-09-20 DIAGNOSIS — I158 Other secondary hypertension: Secondary | ICD-10-CM

## 2014-09-20 MED ORDER — AMLODIPINE BESYLATE 10 MG PO TABS: 10.0000 mg | ORAL_TABLET | Freq: Every day | ORAL | Status: AC

## 2014-09-20 NOTE — Patient Instructions (Signed)
Please increase your amlodipine to 10mg  daily  Your physician has recommended that you wear an event monitor. Event monitors are medical devices that record the heart's electrical activity. Doctors most often [place] these monitors to diagnose arrhythmias. Arrhythmias are problems with the speed or rhythm of the heartbeat. The monitor is a small, portable device. You can wear one while you do your normal daily activities. This is usually used to diagnose what is causing palpitations/syncope (passing out).  You will wear this for 14 days.  Please follow up with Eula Listen, PA in 1 month

## 2014-09-25 DIAGNOSIS — R55 Syncope and collapse: Secondary | ICD-10-CM

## 2014-09-25 DIAGNOSIS — I6789 Other cerebrovascular disease: Secondary | ICD-10-CM

## 2014-10-03 ENCOUNTER — Telehealth: Payer: Self-pay | Admitting: *Deleted

## 2014-10-03 NOTE — Telephone Encounter (Signed)
Please call patient's daughter regarding heart monitor. Her mother wouldn't wear it kept pulling off the tabs.

## 2014-10-04 NOTE — Telephone Encounter (Signed)
Please call the patient. She has dementia at baseline. We can try to push through this as it would be best to document no deleterious arrhythmia. Try to explain to the daughter to calmly tell her mother that we are doing this to look at her heart rate and rhythm. If we see that it is ok we may be less likely to have to place her in the hospital (she is very afraid of the hospital) from a cardiac standpoint. You can tell her that all of her other cardiac testing at [facility] has come back ok and we just need to see a few days worth of her heart rate/rhythm. Let me know how that goes.

## 2014-10-04 NOTE — Telephone Encounter (Signed)
Informed patients daughter of Charlotte Schultz instructions  Patients daughter verbalized understanding

## 2014-10-12 DIAGNOSIS — I10 Essential (primary) hypertension: Secondary | ICD-10-CM | POA: Insufficient documentation

## 2014-10-12 DIAGNOSIS — I43 Cardiomyopathy in diseases classified elsewhere: Secondary | ICD-10-CM

## 2014-10-12 DIAGNOSIS — I119 Hypertensive heart disease without heart failure: Secondary | ICD-10-CM | POA: Insufficient documentation

## 2014-10-12 DIAGNOSIS — A09 Infectious gastroenteritis and colitis, unspecified: Secondary | ICD-10-CM | POA: Insufficient documentation

## 2014-10-12 DIAGNOSIS — F0390 Unspecified dementia without behavioral disturbance: Secondary | ICD-10-CM | POA: Insufficient documentation

## 2014-10-12 DIAGNOSIS — I2510 Atherosclerotic heart disease of native coronary artery without angina pectoris: Secondary | ICD-10-CM | POA: Insufficient documentation

## 2014-10-12 DIAGNOSIS — N83209 Unspecified ovarian cyst, unspecified side: Secondary | ICD-10-CM | POA: Insufficient documentation

## 2014-10-12 DIAGNOSIS — I351 Nonrheumatic aortic (valve) insufficiency: Secondary | ICD-10-CM | POA: Insufficient documentation

## 2014-10-12 DIAGNOSIS — R55 Syncope and collapse: Secondary | ICD-10-CM | POA: Insufficient documentation

## 2014-10-12 DIAGNOSIS — E785 Hyperlipidemia, unspecified: Secondary | ICD-10-CM | POA: Insufficient documentation

## 2014-10-18 ENCOUNTER — Observation Stay: Payer: Self-pay | Admitting: Internal Medicine

## 2014-10-18 LAB — CBC WITH DIFFERENTIAL/PLATELET
Basophil #: 0 10*3/uL (ref 0.0–0.1)
Basophil %: 0.7 %
EOS ABS: 0.1 10*3/uL (ref 0.0–0.7)
EOS PCT: 2 %
HCT: 38.2 % (ref 35.0–47.0)
HGB: 12.2 g/dL (ref 12.0–16.0)
LYMPHS ABS: 2 10*3/uL (ref 1.0–3.6)
LYMPHS PCT: 34.1 %
MCH: 27.9 pg (ref 26.0–34.0)
MCHC: 31.9 g/dL — AB (ref 32.0–36.0)
MCV: 88 fL (ref 80–100)
MONO ABS: 0.5 x10 3/mm (ref 0.2–0.9)
MONOS PCT: 8.1 %
Neutrophil #: 3.3 10*3/uL (ref 1.4–6.5)
Neutrophil %: 55.1 %
Platelet: 184 10*3/uL (ref 150–440)
RBC: 4.37 10*6/uL (ref 3.80–5.20)
RDW: 15.7 % — AB (ref 11.5–14.5)
WBC: 6 10*3/uL (ref 3.6–11.0)

## 2014-10-18 LAB — COMPREHENSIVE METABOLIC PANEL
ALBUMIN: 3.4 g/dL (ref 3.4–5.0)
AST: 19 U/L (ref 15–37)
Alkaline Phosphatase: 71 U/L
Anion Gap: 4 — ABNORMAL LOW (ref 7–16)
BUN: 12 mg/dL (ref 7–18)
Bilirubin,Total: 0.5 mg/dL (ref 0.2–1.0)
CHLORIDE: 112 mmol/L — AB (ref 98–107)
CO2: 28 mmol/L (ref 21–32)
Calcium, Total: 9.6 mg/dL (ref 8.5–10.1)
Creatinine: 0.97 mg/dL (ref 0.60–1.30)
EGFR (Non-African Amer.): 58 — ABNORMAL LOW
Glucose: 109 mg/dL — ABNORMAL HIGH (ref 65–99)
Osmolality: 287 (ref 275–301)
Potassium: 3.8 mmol/L (ref 3.5–5.1)
SGPT (ALT): 14 U/L
Sodium: 144 mmol/L (ref 136–145)
Total Protein: 6.9 g/dL (ref 6.4–8.2)

## 2014-10-18 LAB — URINALYSIS, COMPLETE
Bilirubin,UR: NEGATIVE
Blood: NEGATIVE
GLUCOSE, UR: NEGATIVE mg/dL (ref 0–75)
Hyaline Cast: 2
Ketone: NEGATIVE
LEUKOCYTE ESTERASE: NEGATIVE
NITRITE: NEGATIVE
PROTEIN: NEGATIVE
Ph: 7 (ref 4.5–8.0)
RBC,UR: 3 /HPF (ref 0–5)
Specific Gravity: 1.01 (ref 1.003–1.030)
Squamous Epithelial: 5
WBC UR: 1 /HPF (ref 0–5)

## 2014-10-18 LAB — TROPONIN I
TROPONIN-I: 0.32 ng/mL — AB
Troponin-I: 0.33 ng/mL — ABNORMAL HIGH

## 2014-10-18 LAB — CK-MB
CK-MB: 0.8 ng/mL (ref 0.5–3.6)
CK-MB: 1 ng/mL (ref 0.5–3.6)
CK-MB: 1.2 ng/mL (ref 0.5–3.6)

## 2014-10-18 LAB — PRO B NATRIURETIC PEPTIDE: B-Type Natriuretic Peptide: 5957 pg/mL — ABNORMAL HIGH (ref 0–450)

## 2014-10-19 DIAGNOSIS — I313 Pericardial effusion (noninflammatory): Secondary | ICD-10-CM

## 2014-10-19 LAB — CBC WITH DIFFERENTIAL/PLATELET
Basophil #: 0 10*3/uL (ref 0.0–0.1)
Basophil %: 0.7 %
Eosinophil #: 0.2 10*3/uL (ref 0.0–0.7)
Eosinophil %: 3.3 %
HCT: 29.9 % — ABNORMAL LOW (ref 35.0–47.0)
HGB: 9.6 g/dL — ABNORMAL LOW (ref 12.0–16.0)
LYMPHS ABS: 2.1 10*3/uL (ref 1.0–3.6)
Lymphocyte %: 43.8 %
MCH: 28.2 pg (ref 26.0–34.0)
MCHC: 32.2 g/dL (ref 32.0–36.0)
MCV: 88 fL (ref 80–100)
MONO ABS: 0.4 x10 3/mm (ref 0.2–0.9)
Monocyte %: 8.9 %
Neutrophil #: 2.1 10*3/uL (ref 1.4–6.5)
Neutrophil %: 43.3 %
Platelet: 156 10*3/uL (ref 150–440)
RBC: 3.41 10*6/uL — ABNORMAL LOW (ref 3.80–5.20)
RDW: 15.2 % — AB (ref 11.5–14.5)
WBC: 4.7 10*3/uL (ref 3.6–11.0)

## 2014-10-19 LAB — BASIC METABOLIC PANEL
ANION GAP: 8 (ref 7–16)
BUN: 10 mg/dL (ref 7–18)
CHLORIDE: 111 mmol/L — AB (ref 98–107)
CREATININE: 0.88 mg/dL (ref 0.60–1.30)
Calcium, Total: 8.8 mg/dL (ref 8.5–10.1)
Co2: 25 mmol/L (ref 21–32)
GLUCOSE: 92 mg/dL (ref 65–99)
Osmolality: 286 (ref 275–301)
Potassium: 3.3 mmol/L — ABNORMAL LOW (ref 3.5–5.1)
SODIUM: 144 mmol/L (ref 136–145)

## 2014-10-19 LAB — TROPONIN I: TROPONIN-I: 0.31 ng/mL — AB

## 2014-10-21 ENCOUNTER — Ambulatory Visit: Payer: Self-pay | Admitting: Physician Assistant

## 2014-10-24 ENCOUNTER — Ambulatory Visit (INDEPENDENT_AMBULATORY_CARE_PROVIDER_SITE_OTHER): Payer: Medicare Other | Admitting: Physician Assistant

## 2014-10-24 ENCOUNTER — Encounter: Payer: Self-pay | Admitting: Physician Assistant

## 2014-10-24 VITALS — BP 169/69 | HR 78 | Ht 66.0 in | Wt 114.5 lb

## 2014-10-24 DIAGNOSIS — I110 Hypertensive heart disease with heart failure: Secondary | ICD-10-CM

## 2014-10-24 DIAGNOSIS — I10 Essential (primary) hypertension: Secondary | ICD-10-CM

## 2014-10-24 DIAGNOSIS — I422 Other hypertrophic cardiomyopathy: Secondary | ICD-10-CM

## 2014-10-24 DIAGNOSIS — I2510 Atherosclerotic heart disease of native coronary artery without angina pectoris: Secondary | ICD-10-CM

## 2014-10-24 DIAGNOSIS — R627 Adult failure to thrive: Secondary | ICD-10-CM

## 2014-10-24 DIAGNOSIS — R55 Syncope and collapse: Secondary | ICD-10-CM

## 2014-10-24 DIAGNOSIS — F0390 Unspecified dementia without behavioral disturbance: Secondary | ICD-10-CM

## 2014-10-24 DIAGNOSIS — E785 Hyperlipidemia, unspecified: Secondary | ICD-10-CM

## 2014-10-24 MED ORDER — HYDRALAZINE HCL 10 MG PO TABS: 10.0000 mg | ORAL_TABLET | Freq: Three times a day (TID) | ORAL | Status: AC

## 2014-10-24 NOTE — Patient Instructions (Signed)
We will draw labs today:  BMET  Please start hydralazine 10 mg, 1 tablet 3 times a day  Please follow up with Eula Listen, PA in 3 months

## 2014-10-25 ENCOUNTER — Encounter: Payer: Self-pay | Admitting: Physician Assistant

## 2014-10-25 DIAGNOSIS — I422 Other hypertrophic cardiomyopathy: Principal | ICD-10-CM | POA: Insufficient documentation

## 2014-10-25 DIAGNOSIS — I110 Hypertensive heart disease with heart failure: Secondary | ICD-10-CM | POA: Insufficient documentation

## 2014-10-25 DIAGNOSIS — R627 Adult failure to thrive: Secondary | ICD-10-CM | POA: Insufficient documentation

## 2014-10-25 NOTE — Progress Notes (Signed)
Patient Name: Charlotte Schultz, DOB Apr 15, 1927, MRN [id number]  Date of Encounter: 10/25/2014  Primary Care Provider: [facility] Primary Cardiologist:  Dr. Eden EmmsNishan   Patient Profile:  [age] y.o. female with history below presents for hospital follow up after recent admission [facility]-11/20 for uncontrolled HTN, dementia, and elevated troponin/demand ischemia.    Problem List:   Past Medical History  Diagnosis Date  . Hypertension   . Infectious colitis   . HLD (hyperlipidemia)   . Hypertensive cardiomyopathy     a. 09/2014 Echo: EF 50-55%, sev LVH, mod AI, mild MR.  . Ovarian cyst   . CAD (coronary artery disease)     a. unclear hx.  . Dementia   . Moderate aortic insufficiency     a. 09/2014 Echo: mod AI.  Marland Kitchen. Syncope     a. 09/2014 - normal EEG, nl LV fxn by echo, neg Heat CT.  Marland Kitchen. LVH (left ventricular hypertrophy) due to hypertensive disease    History reviewed. No pertinent past surgical history.   Allergies:  Allergies  Allergen Reactions  . Zetia [Ezetimibe]     unknown  . Aspirin Rash     HPI:  80 y.o. female with the above problem list presents for hospital follow up after recent hospital admission for accelerated HTN. She was also recently admitted [facility] in early October for syncopal episode, unclear etiology. She has a long history of these episodes with possible dehydration or infection as etiology.   Patient with history of CAD s/p MI about 10 years ago - no record of cardiac evaluation or treatment since. She apparently has had syncopal episodes previously without clear etiology being demonstrated. When her syncope has happened in the past she has been found to be dehydrated or have an infection.   Admission to [facility] early October 2/2 syncopal episode while leaving the car and going towards the apartment with her daughter. Work up at that time included no seizure like activity, head CT without acute intracranial pathology - near complete  occlusion of the left maxillary sinus, CXr without cardiopulmonary disease, troponin negative x 4, echo showed EF 50-55%, no regional WMA, moderate aortic regurgitation, mild MR, no patent foramen ovale, peak PA pressure 38 mm Hg. Tele NSR with rare PVC. Resting EEG no seizure activity.   She worse an outpatient cardiac monitor (planned 14 days, but only wore it for 3). Monitor revealed NSR with occasional PVC.   She presented to [facility] on [facility]17 secondary to confusion and was found to have a BP of 199/58. Troponin was checked and found to be 0.33-->0.32-->0.31. Echo was checked and showed EF 50-55%, basal inferior segment is abnormal, elevated left atrial and left ventricular end-diastolic pressures, impaired relaxation of LV diastolic filling, severe LVH, severely dilated left atrium, moderately dilated right atrium, small pericardial effusion, mild to moderate MR, mild aortic sclerosis without stenosis, hypertensive cardiomyopathy. She was placed on HCTZ while inpatient with adequate BP response. She was discharged on her home BP regimen which included HCTZ 12.5 mg, amlodipine 10 mg, Coreg 25 mg bid, and lisinopril 40 mg. It was felt her mildly elevated troponin was 2/2 demand ischemia related to her hypertension.   She continues to not eat well at home except for sweets. She is replacing foods with Boost and Ensure. She does have an appointment with a new PCP this afternoon. No further syncopal episodes. No chest pain.     Home Medications:  Prior to Admission medications  Medication Sig Start Date End Date Taking? Authorizing Provider  amLODipine (NORVASC) 10 MG tablet Take 1 tablet (10 mg total) by mouth daily. 09/20/14  Yes Koi Yarbro M Ayyan Sites, PA-C  aspirin 81 MG tablet Take 81 mg by mouth daily.   Yes Historical Provider, MD  atorvastatin (LIPITOR) 20 MG tablet Take 20 mg by mouth daily.   Yes Historical Provider, MD  carvedilol (COREG) 25 MG tablet Take 25 mg by mouth 2 (two) times daily with a  meal.   Yes Historical Provider, MD  feeding supplement, ENSURE COMPLETE, (ENSURE COMPLETE) LIQD Take 237 mLs by mouth 2 (two) times daily between meals. 09/10/14  Yes Pincus Large, DO  hydrochlorothiazide (MICROZIDE) 12.5 MG capsule Take 12.5 mg by mouth daily.   Yes Historical Provider, MD  lisinopril (PRINIVIL,ZESTRIL) 40 MG tablet Take 40 mg by mouth daily.   Yes Historical Provider, MD  megestrol (MEGACE) 40 MG tablet Take 40 mg by mouth daily.   Yes Historical Provider, MD            Weights: Wt Readings from Last 3 Encounters:  10/24/14 114 lb 8 oz (51.937 kg)  09/20/14 117 lb 4 oz (53.184 kg)  09/08/14 120 lb 3.2 oz (54.522 kg)    Review of Systems:  All other systems reviewed and are otherwise negative except as noted above.  Physical Exam:  Blood pressure 169/69, pulse 78, height 5\' 6"  (1.676 m), weight 114 lb 8 oz (51.937 kg).  General: Pleasant, NAD Psych: Normal affect. Neuro: Alert and oriented X 3. Moves all extremities spontaneously. HEENT: Normal  Neck: Supple without bruits or JVD. Lungs:  Resp regular and unlabored, CTA. Heart: RRR no s3, s4. 2/6 systolic murmur. Abdomen: Soft, non-tender, non-distended, BS + x 4.  Extremities: No clubbing, cyanosis or edema. DP/PT/Radials 2+ and equal bilaterally.   Accessory Clinical Findings:    Assessment & Plan:  1. Hypertensive Cardiomyopathy with recent accelerated hypertension: -Add hydralazine 10 mg tid (goal SBP <180) -Continue Coreg 25 mg bid, lisinopril 40 mg, HCTZ 12.5 mg, amlodipine 10 mg  -Check BMET -Echo as above (10/2014)  2. Syncope: -Cardiac monitor without significant pause or arrhythmia  -Not a candidate for any invasive work-up 2/2 extensive dementia (aortic valve insufficiency is mild to moderate on echo 10/2014 and is functioning normal). Daughter also does not wish to pursue any invasive evaluation. -Has appointment with PCP afternoon of 11/23 - further evaluation through PCP  3. CAD  s/p MI approximately 10 years ago:  -Given her baseline dementia will pursue optimal medical therapy  -Continue aspirin 81 mg, Lipitor 80 mg, Coreg 25 mg bid, lisinopril 40 mg  4. Failure to thrive: -Continue to supplement with Boost and Ensure as tolerated   5. HLD: -Lipitor 80 mg  6. Dementia: -Advanced - requires assistance with ADLSs -PCP may wish to consider [facility]   Eula Listen, PA-C [facility] [address] [place], [place] [address] [phone] [facility] 10/25/2014, 9:39 AM

## 2014-11-01 ENCOUNTER — Ambulatory Visit: Payer: Self-pay | Admitting: Internal Medicine

## 2014-11-17 ENCOUNTER — Encounter (INDEPENDENT_AMBULATORY_CARE_PROVIDER_SITE_OTHER): Payer: Medicare Other

## 2014-11-17 ENCOUNTER — Other Ambulatory Visit: Payer: Self-pay

## 2014-11-17 DIAGNOSIS — R55 Syncope and collapse: Secondary | ICD-10-CM

## 2014-11-17 DIAGNOSIS — I158 Other secondary hypertension: Secondary | ICD-10-CM

## 2014-11-28 ENCOUNTER — Inpatient Hospital Stay: Payer: Self-pay | Admitting: Internal Medicine

## 2014-11-28 LAB — URINALYSIS, COMPLETE
BLOOD: NEGATIVE
Bilirubin,UR: NEGATIVE
Glucose,UR: NEGATIVE mg/dL (ref 0–75)
Hyaline Cast: 2
KETONE: NEGATIVE
Leukocyte Esterase: NEGATIVE
Nitrite: NEGATIVE
Ph: 5 (ref 4.5–8.0)
Protein: NEGATIVE
SPECIFIC GRAVITY: 1.015 (ref 1.003–1.030)

## 2014-11-28 LAB — CK-MB
CK-MB: 1.2 ng/mL (ref 0.5–3.6)
CK-MB: 1.2 ng/mL (ref 0.5–3.6)

## 2014-11-28 LAB — CBC WITH DIFFERENTIAL/PLATELET
Basophil #: 0 10*3/uL (ref 0.0–0.1)
Basophil %: 0.3 %
Eosinophil #: 0 10*3/uL (ref 0.0–0.7)
Eosinophil %: 0.3 %
HCT: 35.6 % (ref 35.0–47.0)
HGB: 11.4 g/dL — ABNORMAL LOW (ref 12.0–16.0)
Lymphocyte #: 1.4 10*3/uL (ref 1.0–3.6)
Lymphocyte %: 12.8 %
MCH: 28.2 pg (ref 26.0–34.0)
MCHC: 32.2 g/dL (ref 32.0–36.0)
MCV: 88 fL (ref 80–100)
MONOS PCT: 5.8 %
Monocyte #: 0.6 x10 3/mm (ref 0.2–0.9)
NEUTROS PCT: 80.8 %
Neutrophil #: 8.9 10*3/uL — ABNORMAL HIGH (ref 1.4–6.5)
Platelet: 230 10*3/uL (ref 150–440)
RBC: 4.06 10*6/uL (ref 3.80–5.20)
RDW: 14.5 % (ref 11.5–14.5)
WBC: 11 10*3/uL (ref 3.6–11.0)

## 2014-11-28 LAB — COMPREHENSIVE METABOLIC PANEL
ALK PHOS: 63 U/L
ANION GAP: 7 (ref 7–16)
Albumin: 3.1 g/dL — ABNORMAL LOW (ref 3.4–5.0)
BILIRUBIN TOTAL: 0.3 mg/dL (ref 0.2–1.0)
BUN: 19 mg/dL — ABNORMAL HIGH (ref 7–18)
CHLORIDE: 117 mmol/L — AB (ref 98–107)
Calcium, Total: 10.2 mg/dL — ABNORMAL HIGH (ref 8.5–10.1)
Co2: 22 mmol/L (ref 21–32)
Creatinine: 1.19 mg/dL (ref 0.60–1.30)
EGFR (African American): 56 — ABNORMAL LOW
EGFR (Non-African Amer.): 46 — ABNORMAL LOW
GLUCOSE: 107 mg/dL — AB (ref 65–99)
OSMOLALITY: 293 (ref 275–301)
POTASSIUM: 4 mmol/L (ref 3.5–5.1)
SGOT(AST): 37 U/L (ref 15–37)
SGPT (ALT): 21 U/L
SODIUM: 146 mmol/L — AB (ref 136–145)
TOTAL PROTEIN: 7.2 g/dL (ref 6.4–8.2)

## 2014-11-28 LAB — TROPONIN I
Troponin-I: 0.21 ng/mL — ABNORMAL HIGH
Troponin-I: 0.23 ng/mL — ABNORMAL HIGH

## 2014-11-28 LAB — LIPASE, BLOOD: Lipase: 273 U/L (ref 73–393)

## 2014-11-29 LAB — CBC WITH DIFFERENTIAL/PLATELET
BASOS PCT: 0.3 %
Basophil #: 0 10*3/uL (ref 0.0–0.1)
Eosinophil #: 0.1 10*3/uL (ref 0.0–0.7)
Eosinophil %: 1.2 %
HCT: 28.8 % — ABNORMAL LOW (ref 35.0–47.0)
HGB: 9.3 g/dL — ABNORMAL LOW (ref 12.0–16.0)
LYMPHS ABS: 1.5 10*3/uL (ref 1.0–3.6)
LYMPHS PCT: 20.3 %
MCH: 28.3 pg (ref 26.0–34.0)
MCHC: 32.3 g/dL (ref 32.0–36.0)
MCV: 88 fL (ref 80–100)
Monocyte #: 0.6 x10 3/mm (ref 0.2–0.9)
Monocyte %: 8.1 %
Neutrophil #: 5.3 10*3/uL (ref 1.4–6.5)
Neutrophil %: 70.1 %
Platelet: 162 10*3/uL (ref 150–440)
RBC: 3.29 10*6/uL — AB (ref 3.80–5.20)
RDW: 14.7 % — AB (ref 11.5–14.5)
WBC: 7.5 10*3/uL (ref 3.6–11.0)

## 2014-11-29 LAB — BASIC METABOLIC PANEL
ANION GAP: 8 (ref 7–16)
BUN: 10 mg/dL (ref 7–18)
CALCIUM: 8.5 mg/dL (ref 8.5–10.1)
CHLORIDE: 116 mmol/L — AB (ref 98–107)
CREATININE: 0.91 mg/dL (ref 0.60–1.30)
Co2: 21 mmol/L (ref 21–32)
EGFR (African American): >60
Glucose: 91 mg/dL (ref 65–99)
OSMOLALITY: 287 (ref 275–301)
POTASSIUM: 3.3 mmol/L — AB (ref 3.5–5.1)
Sodium: 145 mmol/L (ref 136–145)

## 2014-11-29 LAB — MAGNESIUM: MAGNESIUM: 1.5 mg/dL — AB

## 2014-11-29 LAB — LIPID PANEL
Cholesterol: 140 mg/dL (ref 0–200)
HDL: 32 mg/dL — AB (ref 40–60)
LDL CHOLESTEROL, CALC: 96 mg/dL (ref 0–100)
Triglycerides: 60 mg/dL (ref 0–200)
VLDL Cholesterol, Calc: 12 mg/dL (ref 5–40)

## 2014-11-29 LAB — CK-MB: CK-MB: 1.3 ng/mL (ref 0.5–3.6)

## 2014-11-29 LAB — TROPONIN I: TROPONIN-I: 0.21 ng/mL — AB

## 2014-11-29 LAB — CLOSTRIDIUM DIFFICILE(ARMC)

## 2014-11-30 LAB — BASIC METABOLIC PANEL
Anion Gap: 6 — ABNORMAL LOW (ref 7–16)
BUN: 8 mg/dL (ref 7–18)
CHLORIDE: 113 mmol/L — AB (ref 98–107)
Calcium, Total: 8.3 mg/dL — ABNORMAL LOW (ref 8.5–10.1)
Co2: 22 mmol/L (ref 21–32)
Creatinine: 0.96 mg/dL (ref 0.60–1.30)
EGFR (Non-African Amer.): 59 — ABNORMAL LOW
Glucose: 99 mg/dL (ref 65–99)
Osmolality: 280 (ref 275–301)
Potassium: 3.5 mmol/L (ref 3.5–5.1)
SODIUM: 141 mmol/L (ref 136–145)

## 2014-11-30 LAB — TROPONIN I
Troponin-I: 0.22 ng/mL — ABNORMAL HIGH
Troponin-I: 0.21 ng/mL — ABNORMAL HIGH

## 2014-11-30 LAB — CBC WITH DIFFERENTIAL/PLATELET
Basophil #: 0 10*3/uL (ref 0.0–0.1)
Basophil %: 0.2 %
EOS ABS: 0.2 10*3/uL (ref 0.0–0.7)
Eosinophil %: 2.6 %
HCT: 28.4 % — ABNORMAL LOW (ref 35.0–47.0)
HGB: 9.2 g/dL — ABNORMAL LOW (ref 12.0–16.0)
LYMPHS ABS: 1 10*3/uL (ref 1.0–3.6)
LYMPHS PCT: 12.7 %
MCH: 28.1 pg (ref 26.0–34.0)
MCHC: 32.5 g/dL (ref 32.0–36.0)
MCV: 86 fL (ref 80–100)
Monocyte #: 0.7 x10 3/mm (ref 0.2–0.9)
Monocyte %: 8.1 %
Neutrophil #: 6.2 10*3/uL (ref 1.4–6.5)
Neutrophil %: 76.4 %
Platelet: 179 10*3/uL (ref 150–440)
RBC: 3.29 10*6/uL — ABNORMAL LOW (ref 3.80–5.20)
RDW: 14.5 % (ref 11.5–14.5)
WBC: 8.1 10*3/uL (ref 3.6–11.0)

## 2014-11-30 LAB — URINE CULTURE

## 2014-11-30 LAB — MAGNESIUM: Magnesium: 1.9 mg/dL

## 2014-12-01 LAB — STOOL CULTURE

## 2014-12-02 ENCOUNTER — Ambulatory Visit: Payer: Self-pay | Admitting: Internal Medicine

## 2014-12-21 ENCOUNTER — Ambulatory Visit: Payer: Medicare Other | Admitting: Family Medicine

## 2014-12-25 IMAGING — CR DG CHEST 1V PORT
1 series · 1 of 1 positions shown · non-contrast
Comparison: 09/08/2014

CLINICAL DATA: Shortness of Breath, bilateral lower extremity
swelling

EXAM:
PORTABLE CHEST - 1 VIEW

[ap]
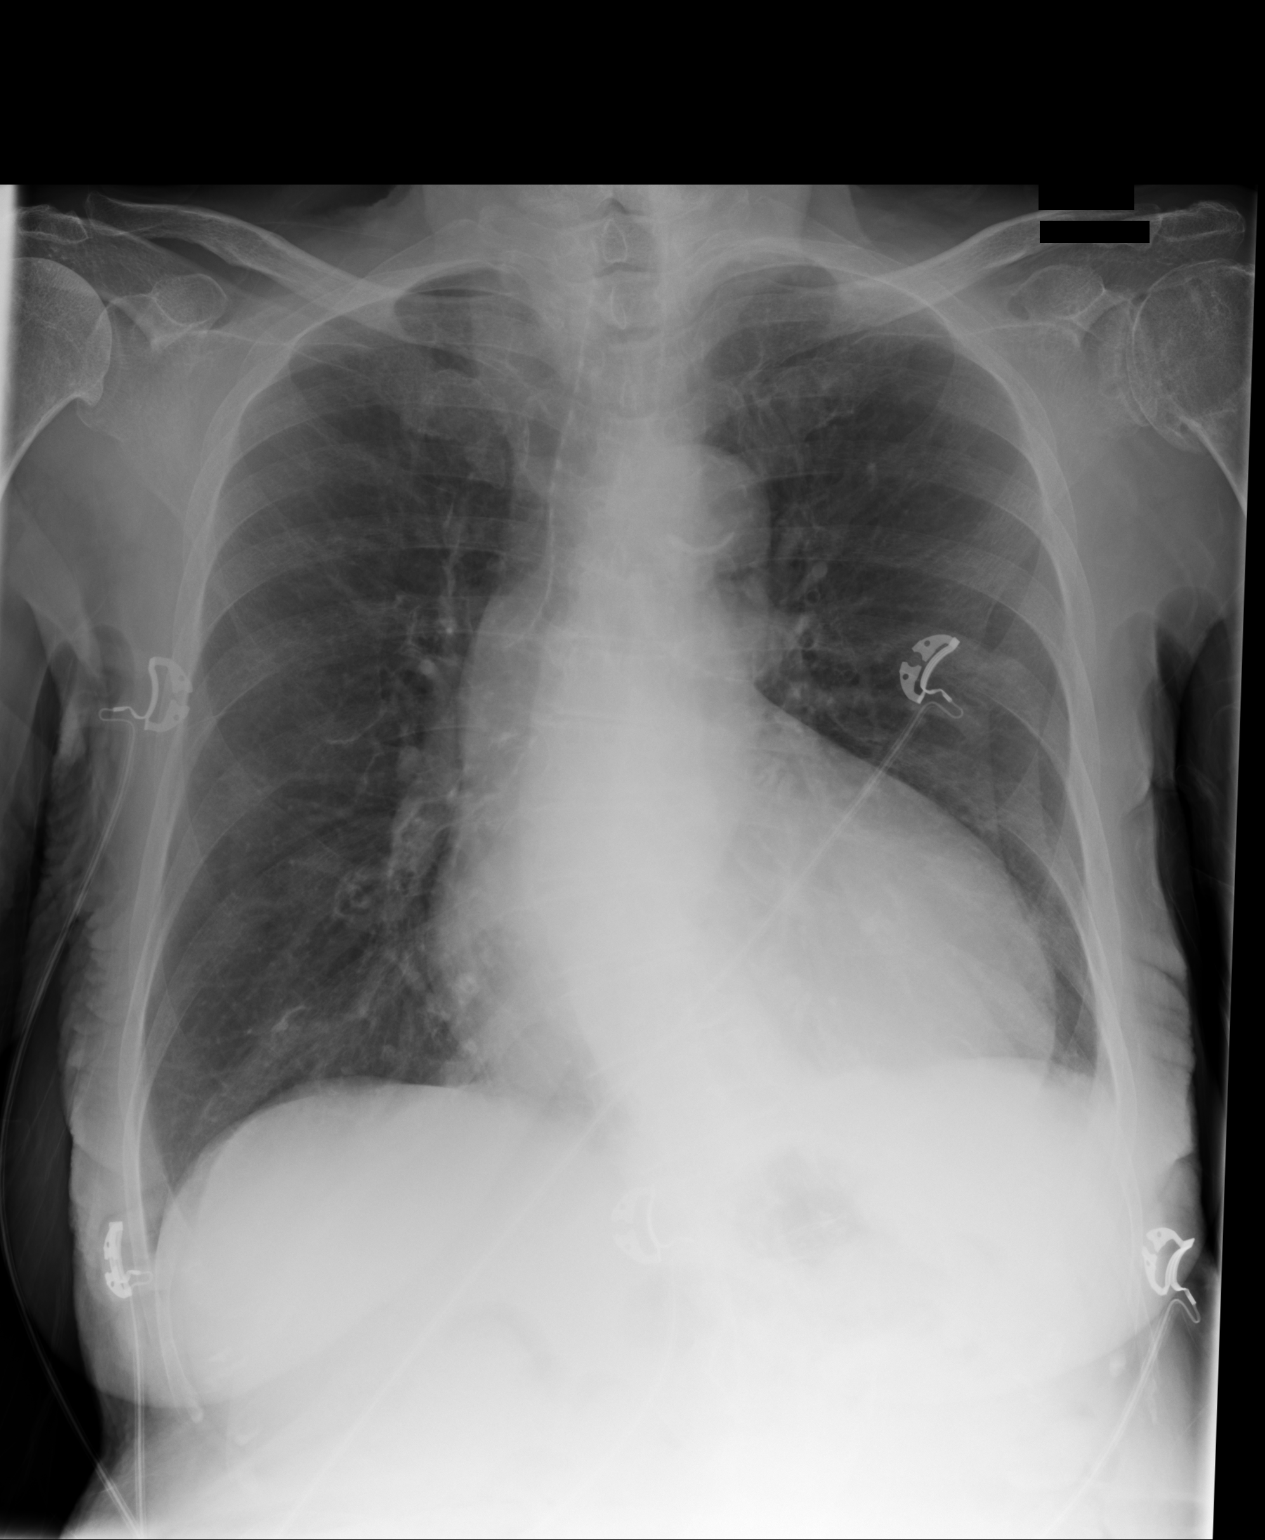

[1 of 1 positions shown; findings below may reference images not displayed]

FINDINGS: Cardiomegaly is noted. Mild lower thoracic dextroscoliosis. No acute
infiltrate or pleural effusion. No pulmonary edema. Atherosclerotic
calcifications of thoracic aorta. Degenerative changes left
shoulder.
IMPRESSION: Mild lower thoracic dextroscoliosis. No active disease. Degenerative
changes left shoulder pre

## 2015-02-04 ENCOUNTER — Emergency Department: Payer: Self-pay | Admitting: Emergency Medicine

## 2015-02-24 ENCOUNTER — Ambulatory Visit: Payer: Medicare Other | Admitting: Family Medicine

## 2015-02-28 ENCOUNTER — Observation Stay
Admit: 2015-02-28 | Disposition: A | Discharge status: Home / Self Care | Payer: Self-pay | Attending: Internal Medicine | Admitting: Internal Medicine

## 2015-02-28 LAB — COMPREHENSIVE METABOLIC PANEL
ALBUMIN: 3.7 g/dL
AST: 30 U/L
Alkaline Phosphatase: 55 U/L
Anion Gap: 9 (ref 7–16)
BILIRUBIN TOTAL: 0.6 mg/dL
BUN: 18 mg/dL
CALCIUM: 10.8 mg/dL — AB
CHLORIDE: 114 mmol/L — AB
Co2: 21 mmol/L — ABNORMAL LOW
Creatinine: 1.15 mg/dL — ABNORMAL HIGH
EGFR (Non-African Amer.): 43 — ABNORMAL LOW
GFR CALC AF AMER: 50 — AB
Glucose: 92 mg/dL
Potassium: 3.8 mmol/L
SGPT (ALT): 12 U/L — ABNORMAL LOW
SODIUM: 144 mmol/L
TOTAL PROTEIN: 7 g/dL

## 2015-02-28 LAB — CBC
HCT: 36.4 % (ref 35.0–47.0)
HGB: 11.7 g/dL — ABNORMAL LOW (ref 12.0–16.0)
MCH: 28.2 pg (ref 26.0–34.0)
MCHC: 32.1 g/dL (ref 32.0–36.0)
MCV: 88 fL (ref 80–100)
PLATELETS: 278 10*3/uL (ref 150–440)
RBC: 4.15 10*6/uL (ref 3.80–5.20)
RDW: 13.9 % (ref 11.5–14.5)
WBC: 6.4 10*3/uL (ref 3.6–11.0)

## 2015-02-28 LAB — URINALYSIS, COMPLETE
BACTERIA: NONE SEEN
Bilirubin,UR: NEGATIVE
GLUCOSE, UR: NEGATIVE mg/dL (ref 0–75)
KETONE: NEGATIVE
Leukocyte Esterase: NEGATIVE
NITRITE: POSITIVE
Ph: 7 (ref 4.5–8.0)
Protein: NEGATIVE
RBC,UR: 15 /HPF (ref 0–5)
SPECIFIC GRAVITY: 1.018 (ref 1.003–1.030)

## 2015-02-28 LAB — TROPONIN I
Troponin-I: 0.05 ng/mL — ABNORMAL HIGH
Troponin-I: 0.05 ng/mL — ABNORMAL HIGH

## 2015-02-28 LAB — LIPASE, BLOOD: LIPASE: 55 U/L — AB

## 2015-02-28 LAB — CK: CK, TOTAL: 78 U/L

## 2015-03-01 LAB — CBC WITH DIFFERENTIAL/PLATELET
BASOS ABS: 0.1 10*3/uL (ref 0.0–0.1)
Basophil %: 1.1 %
EOS PCT: 5.3 %
Eosinophil #: 0.3 10*3/uL (ref 0.0–0.7)
HCT: 25.1 % — ABNORMAL LOW (ref 35.0–47.0)
HGB: 8.2 g/dL — ABNORMAL LOW (ref 12.0–16.0)
LYMPHS ABS: 1.5 10*3/uL (ref 1.0–3.6)
Lymphocyte %: 28.8 %
MCH: 28.3 pg (ref 26.0–34.0)
MCHC: 32.6 g/dL (ref 32.0–36.0)
MCV: 87 fL (ref 80–100)
Monocyte #: 0.4 x10 3/mm (ref 0.2–0.9)
Monocyte %: 8.5 %
NEUTROS ABS: 3 10*3/uL (ref 1.4–6.5)
NEUTROS PCT: 56.3 %
PLATELETS: 186 10*3/uL (ref 150–440)
RBC: 2.89 10*6/uL — AB (ref 3.80–5.20)
RDW: 13.6 % (ref 11.5–14.5)
WBC: 5.3 10*3/uL (ref 3.6–11.0)

## 2015-03-01 LAB — BASIC METABOLIC PANEL
Anion Gap: 3 — ABNORMAL LOW (ref 7–16)
BUN: 13 mg/dL
CO2: 24 mmol/L
CREATININE: 0.93 mg/dL
Calcium, Total: 9.1 mg/dL
Chloride: 112 mmol/L — ABNORMAL HIGH
EGFR (Non-African Amer.): 56 — ABNORMAL LOW
GLUCOSE: 89 mg/dL
Potassium: 3.3 mmol/L — ABNORMAL LOW
Sodium: 139 mmol/L

## 2015-03-01 LAB — MAGNESIUM: Magnesium: 1.7 mg/dL

## 2015-03-01 LAB — TROPONIN I: Troponin-I: 0.04 ng/mL — ABNORMAL HIGH

## 2015-03-02 LAB — URINE CULTURE

## 2015-03-08 ENCOUNTER — Ambulatory Visit: Payer: Medicare Other | Admitting: Family Medicine

## 2015-03-13 ENCOUNTER — Emergency Department
Admit: 2015-03-13 | Disposition: A | Discharge status: Home / Self Care | Payer: Self-pay | Admitting: Emergency Medicine

## 2015-03-13 LAB — COMPREHENSIVE METABOLIC PANEL
ALT: 10 U/L — AB
Albumin: 3.8 g/dL
Alkaline Phosphatase: 64 U/L
Anion Gap: 14 (ref 7–16)
BUN: 12 mg/dL
Bilirubin,Total: 0.5 mg/dL
CHLORIDE: 109 mmol/L
Calcium, Total: 11.2 mg/dL — ABNORMAL HIGH
Co2: 18 mmol/L — ABNORMAL LOW
Creatinine: 1.01 mg/dL — ABNORMAL HIGH
EGFR (African American): 59 — ABNORMAL LOW
EGFR (Non-African Amer.): 51 — ABNORMAL LOW
Glucose: 86 mg/dL
Potassium: 3.2 mmol/L — ABNORMAL LOW
SGOT(AST): 32 U/L
SODIUM: 141 mmol/L
TOTAL PROTEIN: 7.9 g/dL

## 2015-03-13 LAB — CBC
HCT: 38.9 % (ref 35.0–47.0)
HGB: 12.3 g/dL (ref 12.0–16.0)
MCH: 28.1 pg (ref 26.0–34.0)
MCHC: 31.7 g/dL — AB (ref 32.0–36.0)
MCV: 89 fL (ref 80–100)
PLATELETS: 290 10*3/uL (ref 150–440)
RBC: 4.38 10*6/uL (ref 3.80–5.20)
RDW: 14.2 % (ref 11.5–14.5)
WBC: 6.6 10*3/uL (ref 3.6–11.0)

## 2015-03-13 LAB — URINALYSIS, COMPLETE
BACTERIA: NONE SEEN
Bilirubin,UR: NEGATIVE
Blood: NEGATIVE
GLUCOSE, UR: NEGATIVE mg/dL (ref 0–75)
Ketone: NEGATIVE
Leukocyte Esterase: NEGATIVE
Nitrite: NEGATIVE
PH: 7 (ref 4.5–8.0)
Protein: NEGATIVE
Specific Gravity: 1.012 (ref 1.003–1.030)

## 2015-03-13 LAB — LIPASE, BLOOD: Lipase: 39 U/L

## 2015-03-25 NOTE — H&P (Signed)
PATIENT NAME:  Charlotte Schultz, Charlotte Schultz MR#:  [id number] DATE OF BIRTH:  Oct 30, 1927  PRIMARY DOCTOR: Meindert A. Lacie Scotts, MD, who wants the patient in with him to follow up with the [facility] program from January onward.   CHIEF COMPLAINT: Diarrhea.    HISTORY OF PRESENT ILLNESS: An 81 year old female with advanced dementia, brought in by the family because of the diarrhea. The patient having multiple episodes of loose stools since yesterday.  The patient had 2 episodes of loose stools yesterday and 3 today and had decreased p.o. intake since yesterday.  The patient did not have any nausea or vomiting. Did not have fever.  The patient has severe dementia, unable to tell if she had any abdominal pain. The patient lives with the son. Usually she uses a cane for ambulation and eats regular food.   PAST MEDICAL HISTORY: Significant for hypertension, history of hypertensive cardiomyopathy, and history of hyperlipidemia, and anorexia.   ALLERGIES:  No known allergies.  SOCIAL HISTORY: No smoking, no drinking. Lives with the son.   FAMILY HISTORY: Father had dementia, mother died of heart disease.   REVIEW OF SYSTEMS: Not obtainable secondary to severe dementia.   MEDICATIONS AT HOME:   Lisinopril 40 mg p.o. daily, Coreg 25 mg p.o. b.i.d., aspirin 81 mg daily, atorvastatin 20 mg daily, Megace 40 mg once a day, hydrochlorothiazide 25 mg 1/2 tablet once a day.  PHYSICAL EXAMINATION:  VITAL SIGNS:  Temperature 98.6, heart rate 111, blood pressure 110/50, saturation 100% on room air.  GENERAL: The patient is demented, malnourished with BMI of 18.  HEAD: Normocephalic, atraumatic.  EYES: Pupils equal, reacting to light.  No conjunctival pallor.  EARS, NOSE AND THROAT: Nose with no nasal drainage. Ears, no drainage, (. Mouth, no lesions. Clinically dry mucosa. NECK: Supple. Symmetric, no masses.  Thyroid in the midline. Full range of motion without pain. RESPIRATORY: Bilateral clear to auscultation.  No wheeze  notes.  Not using accessory muscles of respiration.  CARDIOVASCULAR: S1, S2, tachycardic.  The patient has no peripheral edema. Pulses equal in carotid, femoral and dorsal pedis.  GASTROINTESTINAL: The patient is grimacing when I palpated the left lower quadrant. Bowel sounds present. No hernias. No tenderness in any other parts of the abdomen. MUSCULOSKELETAL:  Patient able to move all extremities x 4.  NEUROLOGIC: Cranial nerves II through XII intact. Power, unable to assess due to her dementia, not following the commands.  Patient does not have gross neurological deficit.  PSYCHIATRIC:  Does have severe dementia.  Not oriented to time, place, person.  LABORATORY DATA: WBC 11 hemoglobin 11.4, hematocrit 35.6, platelets 230,000. The patient's electrolytes, sodium 146, potassium 4, chloride 117, bicarbonate 22, BUN 19, creatinine 1.19, glucose 107. The patient's lipase 273. UA is clear and bacteria 1+. Troponin is 0.21.  Echocardiogram that was done November 18 showed EF 50%-55% with normal systolic function, basal inferior segment abnormality. The patient had a troponin elevation on November 17 to 0.31 as well.    EKG: Sinus tachycardia, 107 beats per minute. No ST-T changes.   ASSESSMENT AND PLAN: 1.  The patient is an 81 year old female patient with severe dementia, comes in because of watery diarrhea for 2 days with poor p.o. intake. The patient likely had viral gastroenteritis versus colitis. Stool for Clostridium difficile has been sent, but it is pending. Also, I am going to get CT of the abdomen because she has left lower quadrant tenderness. Started on Cipro and Flagyl and follow the stool cultures and admit her  to telemetry.  2.  Hypernatremia secondary to dehydration from diarrhea. Continue fluid resuscitation. 3.  Elevated troponins with non-ST-elevation myocardial infarction, recently was here for the same and echocardiogram was showing ejection fraction 50%. The patient will be having  cycle of troponins and follow her on telemetry and obtain cardiology consult. I am not going to repeat echocardiogram as echocardiogram was done less than 2 months ago.  4.  Severe dementia.  5.  History of hypertensive cardiomyopathy. Will restart her home medications which are Coreg, atorvastatin, hydralazine, and hold the lisinopril  because of dehydration and diarrhea.   Discussed the plan with the patient's son.  CODE STATUS: The patient is full code. We will consult palliative care regarding discussing the goals of the treatment.   TIME SPENT: 55 minutes.    ____________________________ Katha Hamming, MD sk:LT D: 11/28/2014 20:29:35 ET T: 11/28/2014 20:57:09 ET JOB#: [id number]  cc: Katha Hamming, MD, <Dictator> Katha Hamming MD ELECTRONICALLY SIGNED 11/28/2014 22:37

## 2015-03-25 NOTE — Discharge Summary (Signed)
Dates of Admission and Diagnosis:  Date of Admission 18-Oct-2014   Date of Discharge 21-Oct-2014   Admitting Diagnosis uncontrolled hypertension   Final Diagnosis Accelerated hypertension Hypertrophic cardiomyopathy- elevated troponin- likely due to uncontrolled hypertension Demetia Generalized weakness    Chief Complaint/History of Present Illness an 81 year old female with dementia. As per the family, she did not know where she was in the house. She was moaning and was brought in for further evaluation in which blood pressure was found to be very high and she had a borderline troponin. The patient does not complain of shortness of breath or chest pain. Looking back at old records, she was just here last month with elevated troponin and accelerated hypertension. Cardiology recommended no further workup at this time. Confusion seems to be resolved at this point.   Allergies:  No Known Allergies:   Cardiology:  18-Nov-15 08:21   Echo Doppler REASON FOR EXAM:     COMMENTS:     PROCEDURE: [facility] - ECHO DOPPLER COMPLETE(TRANSTHOR)  - Oct 19 2014  8:21AM   RESULT: Echocardiogram Report  Patient Name:   Charlotte Schultz Date of Exam: 10/19/2014 Medical Rec #:  [id number]           Custom1: Date of Birth:  06/10/1927        Height:       66.0 in Patient Age:    87 years         Weight:       119.0 lb Patient Gender: F                BSA:          1.60 m??  Indications: MI Sonographer:    Cristela Blue RDCS Referring Phys: Alford Highland, J  Summary:  1. Left ventricular ejection fraction, by visual estimation, is 50 to  55%.  2. Low normal global left ventricular systolic function.  3. Basal inferior segment is abnormal.  4. Elevated left atrial and left ventricular end-diastolic pressures.  5. Impaired relaxation pattern of LV diastolic filling.  6. Severe concentric left ventricular hypertrophy.  7. Severely increased left ventricular septal thickness.  8. Severely dilated left  atrium.  9. Moderately dilated right atrium. 10. Small pericardial effusion, as described above. 11. The pericardial effusion is globally pericardial effusion. 12. There is None. 13. Mild thickening of the anterior mitral valve leaflet. 14. Mild to moderate mitral valve regurgitation. 15. Mild aortic valve sclerosis without stenosis. 16. Mild to moderate aortic regurgitation. 17. Not adequately visualized inferior vena cava. 18. Severely increased left ventricular posterior wall thickness. 19. Mild tricuspid regurgitation. 20. Cannot exclude a smaller second MR jet. 21. Cannot exclude hypokinesis of the basal inferior/inferoseptal wall. 22. Moderately calcified aortic valve annulus. 23. Hypertensive cardiomyopathy. 2D AND M-MODE MEASUREMENTS (normal ranges withinparentheses): Left Ventricle:          Normal IVSd (2D):      1.71 cm (0.7-1.1) LVPWd (2D):     1.56 cm (0.7-1.1) Aorta/LA:                  Normal LVIDd (2D):     4.42 cm (3.4-5.7) Aortic Root (2D): 3.40 cm (2.4-3.7) LVIDs (2D):     3.29 cm      Left Atrium (2D): 3.10 cm (1.9-4.0) LV FS (2D):     25.6 %   (>25%) LV EF (2D):     50.6 %   (>50%)  Right Ventricle:                                   RVd (2D):        2.14 cm LV SYSTOLIC FUNCTION BY 2D PLANIMETRY (MOD): EF-A4C View: 52.6 % LV DIASTOLIC FUNCTION: MV Peak E: 1.15 m/s E/e' Ratio: 13.90 MV Peak A: 0.89 m/s Decel Time: 164 msec E/A Ratio: 0.46 SPECTRAL DOPPLER ANALYSIS (where applicable): Mitral Valve: MV P1/2 Time: 47.56 msec MV Area, PHT: 4.63 cm?? Aortic Valve: AoV Max Vel: 1.18 m/s AoV Peak PG: 5.6 mmHg AoV Mean PG: LVOT Vmax: 0.76 m/s LVOT VTI:  LVOT Diameter: 2.00 cm AoV Area, Vmax: 2.03 cm?? AoV Area, VTI:  AoV Area, Vmn: Aortic Insufficiency: AI Half-time:  479 msec AI Decel Rate: 2.57 m/s?? Tricuspid Valve and PA/RV Systolic Pressure: TR Max Velocity: 2.27 m/s RA  Pressure: 5 mmHg RVSP/PASP: 25.6 mmHg Pulmonic  Valve: PV Max Velocity: 1.12 m/s PV Max PG: 5.0 mmHg PV Mean PG:  PHYSICIAN INTERPRETATION: Left Ventricle: The left ventricular internal cavity size was normal. LV   septal wall thickness was severely increased. LV posterior wall thickness  was severely increased. Severe concentric left ventricular hypertrophy.  The left ventricular hypertrophy involves all walls. Global LV systolic  function was low normal. Left ventricular ejection fraction, by visual  estimation, is 50 to 55%. Spectral Doppler shows impaired relaxation  pattern of LV diastolic filling. Elevated left atrial and left  ventricular end-diastolic pressures. Findings are consistent with  hypertensive cardiomyopathy. Cannot exclude hypokinesis of the basal  inferior/inferoseptal wall.  LV Wall Scoring: The basal inferior segment is hypokinetic. All remaining scored segments  are normal. Left Atrium: The left atrium is severely dilated. Right Atrium: The right atrium is moderately dilated. Pericardium: A small pericardial effusion is present. The pericardial  effusion is globally located around the entire heart. There is None.  There is no evidence of pericardial constriction. There is no evidence of  pericardial effusoconstriction. There is no evidence of cardiac tamponade. Mitral Valve: There is mild thickening of the anterior mitral valve  leaflet. No evidence of mitral valve stenosis. Mild to moderate mitral  valve regurgitation is seen. The MR jet is eccentric laterally directed.  Cannot exclude a smaller second MR jet. Tricuspid Valve: The tricuspid valve is not well seen. Mild tricuspid  regurgitation is visualized. The tricuspid regurgitant velocity is 2.27  m/s, and with an assumed right atrial pressure of 5 mmHg, the estimated  right ventricular systolic pressure is normal at 25.6 mmHg. Unable to  obtain a good TR jet velocity. Aortic Valve: The aortic valve is tricuspid. Mild aortic valve sclerosis   is  present, with no evidence of aortic valve stenosis. Mild to moderate  aortic valve regurgitation is seen. Moderately calcified aortic valve  annulus. Pulmonic Valve: The pulmonic valve is not well seen. The pulmonic valve  is normal. Trace pulmonic valve regurgitation. Aorta: The aortic root is normal in size and structure. Pulmonary Artery: Unable to accurately assess pulmonary arterial  pressures due to poor acoustic windows and inability to obtain an  adequate TR jet. Venous: The inferior vena cava was not adequately visualized.  [id number] Bryan Lemma Electronically signed by [id number] Bryan Lemma Signature Date/Time: 10/19/2014/1:27:17 PM *** Final ***  IMPRESSION: .    Verified By: Piedad Climes. HARDING, M.D.,  Routine Chem:  17-Nov-15 17:00   B-Type Natriuretic Peptide [employer])  [id number] (Result(s)  reported on 18 Oct 2014 at 07:37PM.)  Cardiac:  17-Nov-15 13:17   Troponin I  0.33 (0.00-0.05 0.05 ng/mL or less: NEGATIVE  Repeat testing in 3-6 hrs  if clinically indicated. >0.05 ng/mL: POTENTIAL  MYOCARDIAL INJURY. Repeat  testing in 3-6 hrs if  clinically indicated. NOTE: An increase or decrease  of 30% or more on serial  testing suggests a  clinically important change)    19:59   Troponin I  0.32 (0.00-0.05 0.05 ng/mL or less: NEGATIVE  Repeat testing in 3-6 hrs  if clinically indicated. >0.05 ng/mL: POTENTIAL  MYOCARDIAL INJURY. Repeat  testing in 3-6 hrs if  clinically indicated. NOTE: An increase or decrease  of 30% or more on serial  testing suggests a  clinically important change)    23:18   Troponin I  0.31 (0.00-0.05 0.05 ng/mL or less: NEGATIVE  Repeat testing in 3-6 hrs  if clinically indicated. >0.05 ng/mL: POTENTIAL  MYOCARDIAL INJURY. Repeat  testing in 3-6 hrs if  clinically indicated. NOTE: An increase or decrease  of 30% or more on serial  testing suggests a  clinically important change)   Pertinent Past History:  Pertinent Past History  Dementia, hypertension, weak heart   Hospital Course:  Hospital Course 1.  Accelerated hypertension.  hydrochlorothiazide 25 mg ,Continue Coreg and lisinopril. The patient has elevated systolic blood pressure with normal diastolic blood pressure. add hydralazine.     BP running in normal range and went up to 110 systolic, so stopped Hydralazine. 2.  Elevated troponin, demand ischemia from accelerated hypertension. No complaints of chest pain or shortness of breath. Was seen by cardiology last hospitalization and recommended no further cardiac workup.     have hypertensive cardiomyopathy- no intervention.- may follow as out pt. 3.  Dementia with altered mental status. Dementia is a progressive disease. There will likely be times where she does have an altered mental status. No signs of infection at this point.  4. generalized weakness.    family insisting placement in a facility- but due to insurance rules, can not place- will arrange for home health services. medically stable for dicharge today - PT suggested Home with 24 hr assist. discharge today.   Condition on Discharge Stable   DISCHARGE INSTRUCTIONS HOME MEDS:  Medication Reconciliation: Patient's Home Medications at Discharge:     Medication Instructions  lisinopril 40 mg oral tablet  1 tab(s) orally once a day   carvedilol 25 mg oral tablet  1 tab(s) orally 2 times a day   aspirin 81 mg oral delayed release tablet  1 tab(s) orally once a day   atorvastatin 20 mg oral tablet  1 tab(s) orally once a day (at bedtime)   megestrol 40 mg oral tablet  1 tab(s) orally once a day   hydrochlorothiazide 25 mg oral tablet  0.5 tab(s) orally once a day     Physician's Instructions:  Home Health? Yes   Home Health Service Physicial Therapy  Nurse  Nurse Aid   Diet Low Sodium   Dietary Supplements Ensure   Dietary Supplements Frequency Three times per day   Activity Limitations As tolerated   Return to Work Not Applicable   Time  frame for Follow Up Appointment 1-2 weeks  PMD   Other Comments havign appointment today with PMD.     Nonlocal MD, MD(Family Physician):   Electronic Signatures: Altamese Dilling (MD)  (Signed [phone] 13:57)  Authored: ADMISSION DATE AND DIAGNOSIS, CHIEF COMPLAINT/HPI, Allergies, PERTINENT LABS, PERTINENT PAST HISTORY,  HOSPITAL COURSE, DISCHARGE INSTRUCTIONS HOME MEDS, PATIENT INSTRUCTIONS, Follow Up Physician   Last Updated: 23-Nov-15 13:57 by Altamese Dilling (MD)

## 2015-03-25 NOTE — H&P (Signed)
PATIENT NAME:  Charlotte Schultz, Charlotte Schultz MR#:  [id number] DATE OF BIRTH:  March 04, 1927  PRIMARY CARE PHYSICIAN:  In [place].  CHIEF COMPLAINT: Brought in with confusion.   HISTORY OF PRESENT ILLNESS: This is an 81 year old female with dementia. As per the family, she did not know where she was in the house. She was moaning and was brought in for further evaluation in which blood pressure was found to be very high and she had a borderline troponin. The patient does not complain of shortness of breath or chest pain. Looking back at old records, she was just here last month with elevated troponin and accelerated hypertension. Cardiology recommended no further workup at this time. Confusion seems to be resolved at this point.   PAST MEDICAL HISTORY: Dementia, hypertension, weak heart.   PAST SURGICAL HISTORY: Unknown.   ALLERGIES: No known drug allergies.   MEDICATIONS: Include aspirin 81 mg daily, atorvastatin 20 mg daily, Coreg 25 mg twice a day, lisinopril 40 mg daily.   SOCIAL HISTORY: No smoking. No alcohol. No drug use. Lives with son, retired Engineer, civil (consulting).   FAMILY HISTORY: Father died of dementia. Mother died of heart disease.  REVIEW OF SYSTEMS:  CONSTITUTIONAL: Positive for weight loss. No fever, chills, or sweats.  EYES: She does wear glasses. EARS, NOSE, MOUTH, AND THROAT: No hearing loss. No sore throat. No difficulty swallowing.  CARDIOVASCULAR: Positive for chest pain in the past, maybe 10 years ago, but none recently.  RESPIRATORY: No shortness of breath. No coughing. No sputum. No hemoptysis.  GASTROINTESTINAL: No nausea. No vomiting. No abdominal pain. No diarrhea. No constipation. No bright red blood per rectum. No melena.   GENITOURINARY: Occasional burning on urination. No hematuria.  MUSCULOSKELETAL: No joint pain or muscle pain.  INTEGUMENT: No rashes or eruptions.  NEUROLOGIC: Syncopal episode 3-4 times per year.  PSYCHIATRIC: History of dementia.  ENDOCRINE: No thyroid problems.   HEMATOLOGIC AND LYMPHATIC: No anemia. No easy bruising or bleeding.   PHYSICAL EXAMINATION: VITAL SIGNS: Temperature 98.2, pulse 76, blood pressure 199/58, respirations 18, pulse oximetry 99% on room air.  GENERAL: No respiratory distress.  EYES: Conjunctivae and lids normal. Pupils equal, round, and reactive to light. Extraocular muscles intact. No nystagmus.  EARS, NOSE, MOUTH, AND THROAT: Tympanic membranes: No erythema. Nasal mucosa: No erythema. Throat: No erythema. No exudate seen. Lips and gums: No lesions.  NECK: No JVD. No bruits. No lymphadenopathy. No thyromegaly. No thyroid nodules palpated.  LUNGS: Clear to auscultation. No use of accessory muscles to breathe. No rhonchi, rales, or wheeze heard.  CARDIOVASCULAR: S1, S2 normal. No gallops, rubs, or murmurs heard. Carotid upstroke 2+ bilaterally. No bruits.  dorsalis pedis pulses 1+ bilaterally. No edema of the lower extremity.  ABDOMEN:  Soft, nontender.  No organomegaly, splenomegaly. Normoactive bowel sounds. No masses felt.  LYMPHATIC: No lymph nodes in the neck.  MUSCULOSKELETAL: No clubbing, edema, cyanosis.  SKIN: No rashes or ulcers seen.  NEUROLOGIC: Cranial nerves II through XII grossly intact. Deep tendon reflexes 1+ bilateral lower extremities.  PSYCHIATRIC: The patient is alert, oriented to person and place.   LABORATORY AND RADIOLOGICAL DATA: Chest x-ray negative. White blood cell count 6.0, H and H, 12.2 and 38.2, platelet count of 184,000. Troponin borderline at 0.33, glucose 109, BUN 12, creatinine 0.97, sodium 144, potassium 3.8, chloride 112, CO2 of 28, calcium 9.6. Liver function tests normal range. Urinalysis negative.   ASSESSMENT AND PLAN: 1.  Accelerated hypertension. We will give hydrochlorothiazide 25 mg stat and daily. Continue Coreg  and lisinopril. The patient has elevated systolic blood pressure with normal diastolic blood pressure. Will continue to monitor.  2.  Elevated troponin, demand ischemia from  accelerated hypertension. No complaints of chest pain or shortness of breath. Was seen by cardiology last hospitalization and recommended no further cardiac workup. I will get an echocardiogram while here.  3.  Dementia with altered mental status. Dementia is a progressive disease. There will likely be times where she does have an altered mental status. No signs of infection at this point. We will hold off on anything further.  TIME SPENT ON ADMISSION: 50 minutes.  CODE STATUS: The patient is a full code.    ____________________________ Herschell Dimes. Renae Gloss, MD rjw:LT D: 10/18/2014 18:46:09 ET T: 10/18/2014 19:24:03 ET JOB#: [id number]  cc: Herschell Dimes. Renae Gloss, MD, <Dictator> Salley Scarlet MD ELECTRONICALLY SIGNED 10/21/2014 16:31

## 2015-03-25 NOTE — Discharge Summary (Signed)
PATIENT NAME:  Charlotte Schultz, Charlotte Schultz MR#:  [id number] DATE OF BIRTH:  1927/07/01  PRIMARY CARE PHYSICIAN:  Nonlocal.   CONSULTATION:  Antonieta Iba, MD  DISCHARGE DIAGNOSES: 1.  Malignant hypertension.  2.  Elevated troponin. 3.  Coronary artery disease.  4.  Dementia.   CONDITION: Stable.   CODE STATUS: Full code.   HOME MEDICATIONS: Please refer to the medication reconciliation list.  DIET: Low-sodium diet.   ACTIVITY: As tolerated.  FOLLOWUP CARE:  With PCP and Dr. Mariah Milling within 1-2 weeks.   REASON FOR ADMISSION: Not feeling well.   HOSPITAL COURSE: The patient is an 81 year old African American female with a history of CAD, hypertension, hyperlipidemia, and dementia. Was sent to ED due to not feeling well. The patient did not have any concerns in the ED. The patient was noted to have a high blood pressure about 211/44. She was treated for malignant hypertension with elevated troponin. For detailed history and physical examination, please refer to the admission note dictated by Dr. Elpidio Anis. The patient's EKG showed normal sinus rhythm with left ventricular hypertrophy.  LABORATORY DATA: Unremarkable except a troponin  0.15, the second and the third one were 0.51 and 0.54.     Malignant hypertension: the patient has been treated with Coreg, Norvasc, and lisinopril. The patient's blood pressure decreased to 127/61 this morning. Dr. Mariah Milling evaluated patient and suggested elevated troponin is possibly due to malignant hypertension and he suggested no further workup. Patient can be discharged to home today. Continue hypertension medication.   The patient is clinically stable. She will be discharged to home today. I discussed the patient's discharge plan with the patient and nurse, Dr. Mariah Milling and the case manager.   TIME SPENT: About 35 minutes.    ____________________________ Shaune Pollack, MD qc:LT D: 09/13/2014 14:23:00 ET T: 09/13/2014 17:43:55 ET JOB#: [id number]  cc: Shaune Pollack,  MD, <Dictator> Shaune Pollack MD ELECTRONICALLY SIGNED 09/14/2014 14:23

## 2015-03-25 NOTE — H&P (Signed)
PATIENT NAME:  GLENISE, FEAST MR#:  [id number] DATE OF BIRTH:  17-Jan-1927  DATE OF ADMISSION:  09/12/2014  PRIMARY CARE PHYSICIAN: Coolidge Breeze, M.D.   CHIEF COMPLAINT: Not feeling well.   HISTORY OF PRESENTING ILLNESS: An 81 year old African American female patient with a history of CAD with catheterization 10 years back, otherwise medical management, along with hypertension, hyperlipidemia, and dementia, presents to the Emergency Room, brought in by her daughter-in-law. The patient was not feeling well.   The patient was initially seen at [facility] in [place] last week for syncope and discharged home on 09/09/2014. The patient had a CT head, EEG, telemetry, and cardiac enzymes which were all normal. An echocardiogram done showed normal ejection fraction with mitral regurgitation and aortic regurgitation. Daughter-in-law mentions that the patient has had a chronic staggering gait, walks slowly, but this seems to be worse over the past few days. Did not have any falls.   All she mentions is that she does not feel well, but, here, the patient does not have any concerns. Says she is alright. Did not have any nausea, vomiting, diarrhea, shortness of breath, chest pain, leg pain, recent travel or surgeries.   The patient recently moved from [place].   During her recent admission in [place] at [facility], the patient had wide pulse pressure and her medications were changed with continuing on Coreg and lisinopril, and Norvasc and hydralazine were held.   PAST MEDICAL HISTORY:  1. CAD.  2. Hypertension.  3. Hyperlipidemia.  4. Dementia.   SOCIAL HISTORY: The patient does not smoke. No alcohol. No illicit drugs. Lives with daughter-in-law. Has dementia.   CODE STATUS: Full Code.   FAMILY HISTORY: CAD.   ALLERGIES: No known drug allergies.   HOME MEDICATIONS:  1. Aspirin 81 mg daily.  2. Atorvastatin 80 mg daily.  3. Coreg 12.5 mg b.i.d.  4. Ensure 3 times  a day.  5. Lisinopril 40 mg daily.   REVIEW OF SYSTEMS: Please see history of presenting illness. The rest of systems reviewed, but the patient denies all reviewed, poor historian. Review of systems unable to be completed due to this.   PHYSICAL EXAMINATION:  VITAL SIGNS: Temperature 98.6, pulse of 105, respirations 18, blood pressure 231/100, presently 197/66, saturating 99% on room air.  GENERAL: Moderately built [place] American elderly female patient, lying in bed, seems comfortable, conversational, cooperative with examination.  PSYCHIATRIC: Alert, awake, not oriented to person, place or time.  HEENT: Atraumatic, normocephalic. Oral mucosa dry and pink. External ears and nose normal. Pallor positive. No icterus. Pupils bilaterally equal and reactive to light.  NECK: Supple. No thyromegaly. No palpable lymph nodes. Trachea midline. No carotid bruit or JVD.  CARDIOVASCULAR: S1, S2 with a loud systolic murmur. Peripheral pulses 2+. No edema.  RESPIRATORY: Normal work of breathing. Clear to auscultation on both sides.  GASTROINTESTINAL: Soft abdomen. Tenderness in the epigastric area. No rigidity or guarding. Bowel sounds present. No hepatosplenomegaly palpable.  SKIN: Warm and dry. No petechiae, rash or ulcers.  MUSCULOSKELETAL: No joint swelling, redness, effusion of the large joints. Normal muscle tone.  NEUROLOGICAL: Motor strength 5/5 in upper and lower extremities. Sensation to fine touch intact all over. Babinski's downgoing. Gait not tested.  LYMPHATIC: No cervical lymphadenopathy.   LABORATORY STUDIES: Show glucose 92, BUN 8, creatinine 0.89, sodium 142, potassium 3.7, chloride 116, bicarbonate 24. GFR greater than 60. Troponin 0.51, WBC 7.7, hemoglobin 11.4, platelets of 227,000. Neutrophils of 60%.   Urinalysis shows trace  bacteria and 2 WBCs.   EKG shows normal sinus rhythm. Has poor R wave progression with left ventricular hypertrophy and repolarization changes.   ASSESSMENT  AND PLAN:  1. Elevated troponin in a patient who is a poor historian, but does have history of coronary artery disease, uncontrolled hypertension. Presently, her blood pressure was significantly elevated at 231/100, which is the likely cause of her elevated troponin with possible demand ischemia. We will also check a CK to rule out rhabdomyolysis in this patient. Discussed the case with Dr. Mechele Collin of cardiology, who will be seeing the patient. Presently, the patient will be on aspirin, statin, and beta blocker. If her troponin does trend up higher, she will need an echocardiogram and possibly cardiac catheterization per cardiology input.  2. Uncontrolled hypertension, likely from stopping her medications recently. We will restart her on Norvasc, increase the dose of Coreg. Give her a stat dose of labetalol 20 mg IV one time and monitor blood pressure closely.  3. Possible urinary tract infection. The patient does have trace bacteria in the urine with 2 WBCs. It is unclear what the reason for the patient not feeling well is. At this point, we will put her on ciprofloxacin to see if this could be playing a part and wait for urine cultures.  4. Dementia. Watch for inpatient delirium.  5. Deep vein thrombosis prophylaxis with Lovenox.   CODE STATUS: Full Code.   TIME SPENT TODAY ON THIS CASE: Was 45 minutes.    ____________________________ Molinda Bailiff Anastasios Melander, MD srs:JT D: 09/12/2014 14:02:17 ET T: 09/12/2014 14:13:59 ET JOB#: [id number]  cc: Wardell Heath R. Elpidio Anis, MD, <Dictator> Coolidge Breeze, MD Orie Fisherman MD ELECTRONICALLY SIGNED 09/14/2014 16:32

## 2015-03-25 NOTE — Consult Note (Signed)
General Aspect Primary Cardiologist: B. Stanford Breed, MD (consult 09/2014) _____________   Pt Profile: 50 y/lo female with recent admission for syncope who presented to [facility] 2/2 feeling poorly and was found to be markedly hypertensive with an elevated troponin of 0.51. _____________  Past Medical History ??? Hypertension  ??? Infectious colitis  ??? HLD (hyperlipidemia)  ??? Hypertensive cardiomyopathy    a. 09/2014 Echo: EF 50-55%, sev LVH, mod AI, mild MR. ??? Ovarian cyst  ??? CAD (coronary artery disease)    a. unclear hx. ??? Dementia  ??? Moderate aortic insufficiency    a. 09/2014 Echo: mod AI. ??? Syncope    a. 09/2014 - normal EEG, nl LV fxn by echo, neg Heat CT. ??? LVH (left ventricular hypertrophy) due to hypertensive disease  _____________  Family History ??? Hypertension   _____________  History  Social History ??? Marital Status: Widowed   Spouse Name: N/A   Number of Children: N/A ??? Years of Education: N/A  Social History Main Topics ??? Smoking status: Never Smoker  ??? Smokeless tobacco: Not on file ??? Alcohol Use: No ??? Drug Use: No ??? Sexual Activity: Not on file  Social History Narrative ??? No narrative on file ______________   Present Illness 81 y/o female with the above complex problem list.  She was just admitted to [facility] on 10/8 following two witnessed syncopal spells.  During admission, head CT was neg, EEG was wnl, and Echo showed nl LV fxn with severe LVH and moderate AI.  Her BP meds were adjusted for fear of possible periodic hypotension and hydralazine and amlodipine were discontinued.  She was d/c'd home on 10/10.   Pt lives with her son and dtr in law.  They moved on Saturday to a new home.  DIL says that she's mostly been in bed since d/c last week.  Today, pt c/o not feeling well.  No specific complaints.  DIL noted that her mental status seemed off and for that reason took her to the ED.  There, she was markedly hypertensive  @ 231/100.  She was admitted for further mgmt.  Trop has returned elevated @ 0.51->0.50.  We've been asked to eval. Trop was nl @ [facility] last week.  She has not c/o chest pain or dyspnea.   Physical Exam:  GEN pleasant, nad.   HEENT pink conjunctivae, moist oral mucosa   NECK supple  no bruits/jvd.   RESP normal resp effort  clear BS   CARD Regular rate and rhythm  2/6 diast murmur rusb/lusb.   ABD denies tenderness  soft  normal BS   EXTR negative cyanosis/clubbing, positive edema   SKIN normal to palpation   NEURO cranial nerves intact, motor/sensory function intact   PSYCH alert, disoriented to time and place.   Review of Systems:  General: malaise earlier - feels much better now.   Skin: No Complaints   ENT: No Complaints   Eyes: No Complaints   Neck: No Complaints   Respiratory: No Complaints   Cardiovascular: No Complaints   Gastrointestinal: No Complaints   Genitourinary: No Complaints   Vascular: No Complaints   Musculoskeletal: No Complaints   Neurologic: No Complaints   Hematologic: No Complaints   Endocrine: No Complaints   Psychiatric: No Complaints   Review of Systems: All other systems were reviewed and found to be negative  ROS somewhat limited by dementia.   Medications/Allergies Reviewed Medications/Allergies reviewed   Lab Results:  Routine Chem:  12-Oct-15 11:20   Result  Comment PLATELETS - FIBRIN STRANDS SEEN ON SMEAR. THIS MAY  - AFFECT THE PLATELET COUNT. THE ACTUAL  - NUMERICAL COUNT MAY BE SOMEWHAT HIGHER  - THAN THE REPORTED VALUE.  Result(s) reported on 12 Sep 2014 at 12:59PM.  Result Comment Troponin - RESULTS VERIFIED BY REPEAT TESTING.  - Elevated troponin called to and read  - back by Barron Schmid in ER @ 1221  - 09/12/14. BGB  Result(s) reported on 12 Sep 2014 at 12:14PM.  Glucose, Serum 92  BUN 8  Creatinine (comp) 0.89  Sodium, Serum 145  Potassium, Serum 3.7  Chloride, Serum  116  CO2, Serum 24  Calcium  (Total), Serum 9.4  Anion Gap  5  Osmolality (calc) 287  eGFR (African American) >60  eGFR (Non-African American) >60 (eGFR values <46m/min/1.73 m2 may be an indication of chronic kidney disease (CKD). Calculated eGFR, using the MRDR Study equation, is useful in  patients with stable renal function. The eGFR calculation will not be reliable in acutely ill patients when serum creatinine is changing rapidly. It is not useful in patients on dialysis. The eGFR calculation may not be applicable to patients at the low and high extremes of body sizes, pregnant women, and vetetarians.)    15:07   Result Comment TROPONIN - RESULTS VERIFIED BY REPEAT TESTING.  - PREVIOUSLY CALLED 09/12/14 @ 1221  - BY BGB..SRB  Result(s) reported on 12 Sep 2014 at 04:08PM.  Cardiac:  12-Oct-15 11:20   Troponin I  0.51 (0.00-0.05 0.05 ng/mL or less: NEGATIVE  Repeat testing in 3-6 hrs  if clinically indicated. >0.05 ng/mL: POTENTIAL  MYOCARDIAL INJURY. Repeat  testing in 3-6 hrs if  clinically indicated. NOTE: An increase or decrease  of 30% or more on serial  testing suggests a  clinically important change)  CPK-MB, Serum 0.6 (Result(s) reported on 12 Sep 2014 at 12:53PM.)    15:07   Troponin I  0.50 (0.00-0.05 0.05 ng/mL or less: NEGATIVE  Repeat testing in 3-6 hrs  if clinically indicated. >0.05 ng/mL: POTENTIAL  MYOCARDIAL INJURY. Repeat  testing in 3-6 hrs if  clinically indicated. NOTE: An increase or decrease  of 30% or more on serial  testing suggests a  clinically important change)  CK, Total 43 (26-192 NOTE: NEW REFERENCE RANGE  01/03/2014)  Routine UA:  12-Oct-15 12:41   Color (UA) Yellow  Clarity (UA) Clear  Glucose (UA) Negative  Bilirubin (UA) Negative  Ketones (UA) Negative  Specific Gravity (UA) 1.011  Blood (UA) Negative  pH (UA) 6.0  Protein (UA) Negative  Nitrite (UA) Negative  Leukocyte Esterase (UA) Negative (Result(s) reported on 12 Sep 2014 at 01:15PM.)  RBC  (UA) 3 /HPF  WBC (UA) 2 /HPF  Bacteria (UA) TRACE  Epithelial Cells (UA) 1 /HPF  Mucous (UA) PRESENT (Result(s) reported on 12 Sep 2014 at 01:15PM.)  Routine Hem:  12-Oct-15 11:20   WBC (CBC) 7.7  RBC (CBC) 4.07  Hemoglobin (CBC)  11.4  Hematocrit (CBC)  34.8  Platelet Count (CBC) 227  MCV 85  MCH 28.1  MCHC 32.9  RDW  15.0  Neutrophil % 60.2  Lymphocyte % 27.8  Monocyte % 9.6  Eosinophil % 1.8  Basophil % 0.6  Neutrophil # 4.6  Lymphocyte # 2.2  Monocyte # 0.7  Eosinophil # 0.1  Basophil # 0.0   EKG:  EKG Interp. by me   Interpretation rsr, 97, LAE, LVH, poor R prog.    No Known Allergies:   Vital Signs/Nurse's Notes: **  Vital Signs.:   12-Oct-15 15:30  Vital Signs Type Admission  Temperature Temperature (F) 98.5  Celsius 36.9  Temperature Source oral  Pulse Pulse 91  Respirations Respirations 18  Systolic BP Systolic BP 784  Diastolic BP (mmHg) Diastolic BP (mmHg) 44  Mean BP 99  Systolic BP Systolic BP 696  Diastolic BP (mmHg) Diastolic BP (mmHg) 44  Pulse Lying Pulse Lying 91  Systolic BP Systolic BP 295  Diastolic BP (mmHg) Diastolic BP (mmHg) 64  Pulse Pulse Sitting 88  Systolic BP Systolic BP 284  Diastolic BP (mmHg) Diastolic BP (mmHg) 69  Pulse Standing Pulse Standing 92  Pulse Ox % Pulse Ox % 99  Oxygen Delivery Room Air/ 21 %    Impression 1.  Hypertensive Urgency:  Pt presented with generalized malaise and was found to be markedly hypertensive.  She is on bb and acei at home and was also previously on amlodipine and hydralazine, which were both d/c'd last week during admission for recurrent sycope and labile blood pressures.  Amlodipine has been resumed here and coreg/lisinopril have been continued.  Follow BP's closely and titrate amlodipine and potentially resume low dose hydralazine as necessary.  It is likely that we will have to let her trend a little higher than usual in order to avoid issues with syncope/relative hypotn.  2.  Elevated  troponin:  Trop is elevated @ 0.51->0.50 upon admission.  Troponin leak likely secondary to marked HTN in the setting of known severe LVH.  No h/o chest pain or dyspnea.  Trop trend flat.  Nl EF by echo last week.  Would not pursue ischemic evaluation or anticoagulate at this time.  I discussed this with the pts dtr-in-law and she is in full agreement.  Cont bb, stat, asa.  3.  H/O Syncope:  In setting of labile BP's.  Follow in setting of #1.  4.  UTI:  ABX per IM.   Electronic Signatures for Addendum Section:  Kathlyn Sacramento (MD) (Signed Addendum 12-Oct-15 17:42)  The patient was seen and examined. Agree with the above. No chest pain . Elevated TnI is likely due to supply demand ischemia related to high BP.   Electronic Signatures: Kathlyn Sacramento (MD)  (Signed 12-Oct-15 17:42)  Co-Signer: General Aspect/Present Illness, Home Medications, Allergies Rogelia Mire (NP)  (Signed 12-Oct-15 17:17)  Authored: General Aspect/Present Illness, History and Physical Exam, Review of System, Home Medications, Labs, EKG , Allergies, Vital Signs/Nurse's Notes, Impression/Plan   Last Updated: 12-Oct-15 17:42 by Kathlyn Sacramento (MD)

## 2015-03-29 NOTE — Consult Note (Signed)
PATIENT NAME:  Charlotte Schultz, Charlotte Schultz MR#:  [id number] DATE OF BIRTH:  1927-09-28  DATE OF CONSULTATION:  11/29/2014  REFERRING PHYSICIAN:  Katha Hamming, MD  CONSULTING PHYSICIAN:  Dwayne D. Callwood, MD  PRIMARY PHYSICIAN: Meindert A. Lacie Scotts, MD  INDICATION: Elevated troponin.  HISTORY OF PRESENT ILLNESS: The patient is an 81 year old female with advanced dementia brought in by family because of diarrhea. The patient complains of multiple episodes of loose stools since the day before admission; the patient had 2 episodes of loose stools the day before and 3 the day of admission with decreased p.o. intake. The patient did not have any nausea or vomiting. Denied any fever. The patient has advanced dementia, she is unable to give much of a history. The patient lives with the son, usually uses a cane for ambulation. Eats regular food. Denied any chest pain by history but was found to have elevated troponins, so cardiology consultation was recommended.   PAST MEDICAL HISTORY: Hypertension, hypertensive cardiomyopathy, hyperlipidemia, anorexia, dementia.   ALLERGIES: None.   SOCIAL HISTORY: No smoking or alcohol consumption. Retired. Lives with her son.   FAMILY HISTORY: Father had dementia. Mother died of heart disease.   REVIEW OF SYSTEMS: Unobtainable because of dementia.   MEDICATIONS: Lisinopril 40 a day, Coreg 25 twice a day, aspirin 81 mg a day, atorvastatin 20 a day, Megace 40 mg once a day, HCTZ 25 one-half tablet a day.   PHYSICAL EXAMINATION:  VITAL SIGNS: Blood pressure 110/50, pulse of 110, respiratory rate 16, afebrile.  HEENT: Normocephalic, atraumatic. Pupils equal and reactive to light.  NECK: Supple. No significant JVD, bruits or adenopathy.  LUNGS: Clear to auscultation and percussion. No significant wheeze, rhonchi, or rale.  HEART: Tachycardic, irregular. Systolic ejection murmur at apex.  ABDOMEN: Benign.  EXTREMITIES: Within normal limits.   NEUROLOGIC: Intact.   SKIN: Normal.  PSYCHIATRIC: Severe dementia, confused.   DIAGNOSTIC DATA: White count of 11, hemoglobin 11.4, hematocrit 35, platelet count of 230,000. Sodium 146, potassium 4, chloride of 117, bicarbonate 22, BUN 19, creatinine 1.19, glucose of 107, lipase 273. Urinalysis unremarkable. Troponin 0.21. Echocardiogram done in December shows ejection fraction between 50% and 55%. Troponins were slightly elevated back in November as well.   EKG: Sinus tachycardia, rate of about 110, nonspecific finding.  IMPRESSION: Elevated troponin, possible demand ischemia; mild hypernatremia; dementia; hypertension; hypertensive cardiomyopathy; mild anemia.   PLAN:  1.  Agree with admit. Treat for diarrhea. Rule out infectious cause. Rehydrate. Send off a stool cultures and Gram stain. Rule out Clostridium difficile.  2.  Follow up troponins. If they stay about the same, this is probably related to demand ischemia. I do not recommend anticoagulation. I do not recommend invasive studies. Would not repeat echocardiogram. Treat the patient conservatively and medically.  3.  Hypertension. Continue current therapy with lisinopril, HCTZ, Coreg. Continue aspirin for secondary prevention.  4. For dementia continue current therapies, advanced, severe. Consider memory care unit.  5.  Deep vein thrombosis prophylaxis should be  6.  If the patient's troponins stay stable, would consider conservative therapy and treatment as an outpatient and hopefully discharge home soon.    ____________________________ Bobbie Stack Juliann Pares, MD ddc:bm D: 11/29/2014 23:51:00 ET T: 11/30/2014 00:47:11 ET JOB#: [id number]  cc: Dwayne D. Juliann Pares, MD, <Dictator> Alwyn Pea MD ELECTRONICALLY SIGNED 12/21/2014 14:04

## 2015-04-02 NOTE — Discharge Summary (Signed)
PATIENT NAME:  Charlotte Schultz, Charlotte Schultz MR#:  [id number] DATE OF BIRTH:  [birth year]  DATE OF ADMISSION:  02/28/2015 DATE OF DISCHARGE:  03/02/2015  ADMITTING DIAGNOSIS: Abdominal pain.   DISCHARGE DIAGNOSES:  1.  Abdominal pain due to severe constipation, status post treatment with Fleet Enema with good results, now her symptoms have resolved.  2.  Acute kidney injury due to dehydration, now improved with IV fluids.  3.  Elevated troponin without cardiovascular symptoms, felt to be due to demand ischemia.  4.  Abnormal urinalysis without evidence of urinary tract infection and urine cultures mixed bacterial flora.  5.  Essential hypertension.  6.  Dementia without behavioral disturbances.   EVALUATIONS: Admitting sodium 144, potassium 3.8, chloride 114, bicarbonate 21, anion gap 9, BUN 18, creatinine 1.15, glucose 92. LFTs were normal. Troponin 0.05. WBC 6.4, hemoglobin 11.7. CT scan of the abdomen and pelvis revealed a large fecal impaction of the  rectum with extensive stool in the remainder of the colon.   HOSPITAL COURSE: Please refer to H and P done by the admitting physician. The patient is an 81 year old African-American female who resides with family at home, who was brought in with abdominal pain. The patient had a CT scan which showed severe stool in her colon. Due to these symptoms we were asked to admit the patient. The patient was admitted and given a Fleet Enema and was started on a bowel regimen with significant improvement in her symptoms. The patient has no further abdominal pain and has had good bowel movements. At this time she is doing much better. Home health has also been arranged for the patient and she is stable for discharge.   DISCHARGE MEDICATIONS: Amlodipine 10 daily, hydralazine 10 one tab p.o. t.i.d., atorvastatin 20 at bedtime, Coreg 25 one tab p.o. b.i.d., aspirin 81 one tab p.o. daily, mirtazapine 30 at bedtime, Colace 100 one tab p.o. b.i.d., senna 1 tab p.o. daily as  needed, MiraLax 17 grams daily, Fleet enema rectally once a day as needed  Home physical therapy, nurse, and nurse aide are referred.    DIET: Low-sodium, low-fat, low-cholesterol.   ACTIVITY: As tolerated.   FOLLOWUP:  With the primary MD  in 1-2 weeks.   TIME SPENT ON THIS DISCHARGE: 35 minutes.     ____________________________ Lacie Scotts Allena Katz, MD shp:bu D: 03/02/2015 21:05:57 ET T: 03/03/2015 10:40:51 ET JOB#: [id number]  cc: Janaisa Birkland H. Allena Katz, MD, <Dictator> Charise Carwin MD ELECTRONICALLY SIGNED 03/03/2015 14:19

## 2015-04-02 NOTE — Discharge Summary (Signed)
PATIENT NAME:  Charlotte Schultz, Charlotte Schultz MR#:  [id number] DATE OF BIRTH:  Sep 16, 1927  DATE OF ADMISSION:  11/28/2014 DATE OF DISCHARGE:  12/01/2014  DISCHARGE DIAGNOSES:  1.  Viral gastroenteritis.  2.  Hyponatremia. 3.  Elevated troponin due to supply demand ischemia.  4.  Right adnexal cyst (5 cm) requiring outpatient obstetric gynecological follow-up.   SECONDARY DIAGNOSES:    hypertension, hypertensive cardiomyopathy, hyperlipidemia, and anorexia.   CONSULTATION:  1.  Cardiology, Dorothyann Peng, MD  2.  Palliative care Ned Grace, MD   PROCEDURES AND RADIOLOGY:   1.  CT scan of the abdomen and pelvis on 11/28/2014 showed colonic diverticulosis. A 5 cm right adnexal cyst. Gas in the urinary bladder.  2.  Pelvic ultrasound 11/30/2014 showed status post hysterectomy.  3.  Right ovarian cyst appears simple, measuring 5.1 cm certainly benign.   MAJOR LABORATORY PANEL: Urinalysis on admission was negative. Urine culture had no growth on 11/28/2014.  Stool for Clostridium difficile was negative. Stool for comprehensive culture was negative.   HISTORY AND SHORT HOSPITAL COURSE: The patient is an 81 year old female with the above-mentioned medical problems who was admitted for diarrhea. She was also found to have some hypernatremia and elevated troponin, which was thought to be due to supply demand ischemia. Please see Dr. Suzanne Boron dictated history and physical for further details. Cardiology consultation was obtained with Dr. Dorothyann Peng who recommended ruling it out with serial troponins, although on admission which was thought to be due to supply demand ischemia.  Her stool culture remained negative. her diarrhea to be a viral gastroenteritis.  Her hyponatremia resolved with hydration.  Of note, of incidental findings, she had a CT abdomen and pelvis on admission which showed incidental adnexal cyst of 5 cm on the right side, which was evaluated on a pelvic ultrasound and thought to be of a  benign nature based on her ultrasound findings.  The patient was feeling much better by the 12/01/2014 and was discharged home in stable condition.   VITAL SIGNS: On the date of discharge, her vital signs are as follows: Temperature 98.4, heart rate 76 per minute, respirations 20 per minute, blood pressure 137/62. She is saturating 99% on room air.   PERTINENT PHYSICAL EXAMINATION ON THE DATE OF DISCHARGE:  CARDIOVASCULAR: S1, S2 normal. No murmur, rubs or gallop.  LUNGS: Clear to auscultation bilaterally. No wheezing, rales, or crepitation.  ABDOMEN: Soft, benign.  NEUROLOGIC: Nonfocal examination.  All other physical examination remained at baseline.   DISCHARGE MEDICATIONS:   Medication Instructions  carvedilol 25 mg oral tablet  1 tab(s) orally 2 times a day   atorvastatin 20 mg oral tablet  1 tab(s) orally once a day (at bedtime)   amlodipine 10 mg oral tablet  1 tab(s) orally once a day   hydralazine 10 mg oral tablet  1 tab(s) orally 3 times a day   loperamide 2 mg oral capsule  1 cap(s) orally 4 times a day, As Needed, diarrhea , As needed, diarrhea   lactobacillus acidophilus  1 cap(s) orally 2 times a day    DISCHARGE DIET: Regular.   DISCHARGE ACTIVITY: As tolerated.   DISCHARGE INSTRUCTIONS AND FOLLOW-UP:  The patient was instructed to follow up with her primary care physician, Dr. Evelene Croon in 1 to 2 weeks and or with the [facility] program. She will need follow-up with [facility] in 2 to 4 weeks.    TOTAL TIME SPENT WITH THIS PATIENT:  40 minutes.  ____________________________ Ellamae Sia. Sherryll Burger, MD vss:DT D: 12/05/2014 10:11:59 ET T: 12/05/2014 11:03:20 ET JOB#: [id number]  cc: Dempsey Ahonen S. Sherryll Burger, MD, <Dictator> Meindert A. Lacie Scotts, MD Dwayne D. Juliann Pares, MD [facility]  Ellamae Sia [facility] MD ELECTRONICALLY SIGNED 12/05/2014 11:53

## 2015-04-02 NOTE — H&P (Signed)
PATIENT NAME:  Charlotte Schultz, Charlotte Schultz MR#:  [id number] DATE OF BIRTH:  1926/12/06  DATE OF ADMISSION:  02/28/2015  REFERRING PHYSICIAN:  Caryn Bee A. Paduchowski, MD   PRIMARY CARE PHYSICIAN: Meindert A. Lacie Scotts, MD   CHIEF COMPLAINT: Abdominal pain.   HISTORY OF PRESENT ILLNESS: An 81 year old African American female with past medical history of dementia; essential hypertension; hyperlipidemia, unspecified, presenting with abdominal pain. History aided by family members present at bedside, as patient unable to provide meaningful information given mental status and medical condition. Per her son at bedside,  said that she awoke the day of admission, screaming in pain, clutching her abdomen and her right lower quadrant suprapubic region. Unable to further quantify pain, thus presented to the hospital for further workup and evaluation. States that she does have bowel movements daily; however, they are small. No recent nausea, vomiting, or intolerance of p.o. In the Emergency Department, initial workup reveals fecal impaction of the rectum. Did receive an enema with minimal results.   REVIEW OF SYSTEMS: Unable to further obtain given the patient's mental status and medical condition.   PAST MEDICAL HISTORY: Includes essential hypertension; hyperlipidemia, unspecified; dementia without behavioral disturbances.   SOCIAL HISTORY: Ambulatory without assistive devices. No alcohol or tobacco use.   FAMILY HISTORY: No known cardiovascular or pulmonary disorders.   ALLERGIES: No known drug allergies.   HOME MEDICATIONS: Include aspirin 81 mg p.o. daily, atorvastatin 20 mg p.o. daily, Coreg 25 mg p.o. b.i.d., Norvasc 10 mg p.o. daily, hydralazine 10 mg p.o. 3 times daily.   PHYSICAL EXAMINATION:  VITAL SIGNS: Temperature 97.5, heart rate of 85, respirations of 20, blood pressure 137/50, saturating 100% on room air. Weight 50.8 kg, BMI 18.1.  GENERAL: A frail-appearing African American female currently in no  acute distress.  HEAD: Normocephalic, atraumatic.  EYES: Pupils equal, round, reactive to light. Extraocular muscles intact. No scleral icterus.  MOUTH: Dry mucosal membrane. Dentition intact. No abscess noted.  EAR, NOSE, AND THROAT: Clear without exudates. No external lesions.  NECK: Supple. No thyromegaly. No nodules. No JVD.  PULMONARY: Clear to auscultation bilaterally without wheezes, rubs, or rhonchi. No use of accessory muscles. Good respiratory effort.  CHEST: Nontender to palpation.  CARDIOVASCULAR: S1, S2, regular rate and rhythm with a 2-3/6 systolic ejection murmur. No edema. Pedal pulses 2+ bilaterally.  GASTROINTESTINAL: Soft, nontender, nondistended. No masses. Hypoactive bowel sounds. No appreciable hepatosplenomegaly.  MUSCULOSKELETAL: No swelling, clubbing, or edema. Range of motion full in all extremities.  NEUROLOGIC: Cranial nerves II-XII intact. No gross focal neurological deficits. Sensation intact. Reflexes intact.  SKIN: No ulceration, lesions, rashes, or cyanosis. Skin warm, dry. Turgor intact.  PSYCHIATRIC: Mood, affect flattened. She is awake, alert, oriented to person. At this time, has some difficulty answering questions appropriately.   LABORATORY DATA: Sodium of 144, potassium 3.8, chloride 114, bicarbonate of 21, anion gap of 9, BUN 18, creatinine 1.15, glucose 92. LFTs within normal limits. Troponin of 0.05. WBC of 6.4, hemoglobin 11.7, platelets of 278,000. Urinalysis performed: WBCs 4, RBCs 15, nitrite positive, epithelial cells 1. CT of the abdomen and pelvis reveals a large fecal impaction the rectum with extensive stool in the remainder of the colon.   ASSESSMENT AND PLAN: An 81 year old African American female with a history of dementia without behavior disturbances; hypertension, essential, presenting with abdominal pain.  1.  Fecal impaction of the rectum. We will initiate bowel regimen including senna, Colace, and MiraLax . We will also get another enema  for results. If she is unable  to have a bowel movement despite these measures, may require gastroenterology input.   2.  Acute kidney injury. Intravenous fluid hydration. Follow renal function and urine output.  3.  Elevated troponin without known cardiovascular symptoms. We will place on telemetry. Trend cardiac enzymes x 3.  4.  Urinary tract infection, site unspecified. Antibiotic coverage with ceftriaxone, as initiated in the Emergency Department.  5.  Hypertension, essential. Continue with Norvasc as well as Coreg and hydralazine.  6.  Venous thromboembolism prophylaxis. Heparin subcutaneously.   CODE STATUS: The patient is a full code.   TIME SPENT: 45 minutes.    ____________________________ Cletis Athens. Dawnetta Copenhaver, MD dkh:bm D: 02/28/2015 22:16:33 ET T: 02/28/2015 22:39:33 ET JOB#: [id number]  cc: Cletis Athens. Evangela Heffler, MD, <Dictator> Cyriah Childrey Synetta Shadow MD ELECTRONICALLY SIGNED 03/02/2015 13:09

## 2015-04-10 ENCOUNTER — Emergency Department: Payer: Medicare (Managed Care)

## 2015-04-10 ENCOUNTER — Encounter: Payer: Self-pay | Admitting: *Deleted

## 2015-04-10 ENCOUNTER — Emergency Department
Admission: EM | Admit: 2015-04-10 | Discharge: 2015-04-11 | Disposition: A | Discharge status: Home / Self Care | Payer: Medicare (Managed Care) | Attending: Emergency Medicine | Admitting: Emergency Medicine

## 2015-04-10 DIAGNOSIS — F0390 Unspecified dementia without behavioral disturbance: Secondary | ICD-10-CM | POA: Diagnosis present

## 2015-04-10 DIAGNOSIS — I110 Hypertensive heart disease with heart failure: Secondary | ICD-10-CM | POA: Diagnosis not present

## 2015-04-10 DIAGNOSIS — I509 Heart failure, unspecified: Secondary | ICD-10-CM | POA: Insufficient documentation

## 2015-04-10 DIAGNOSIS — Z7982 Long term (current) use of aspirin: Secondary | ICD-10-CM | POA: Insufficient documentation

## 2015-04-10 DIAGNOSIS — F0391 Unspecified dementia with behavioral disturbance: Secondary | ICD-10-CM | POA: Diagnosis not present

## 2015-04-10 DIAGNOSIS — Z79899 Other long term (current) drug therapy: Secondary | ICD-10-CM | POA: Insufficient documentation

## 2015-04-10 DIAGNOSIS — I422 Other hypertrophic cardiomyopathy: Secondary | ICD-10-CM | POA: Diagnosis not present

## 2015-04-10 LAB — COMPREHENSIVE METABOLIC PANEL
ALT: 11 U/L — AB (ref 14–54)
AST: 21 U/L (ref 15–41)
Albumin: 3.7 g/dL (ref 3.5–5.0)
Alkaline Phosphatase: 64 U/L (ref 38–126)
Anion gap: 9 (ref 5–15)
BUN: 20 mg/dL (ref 6–20)
CALCIUM: 10.8 mg/dL — AB (ref 8.9–10.3)
CO2: 28 mmol/L (ref 22–32)
CREATININE: 1.08 mg/dL — AB (ref 0.44–1.00)
Chloride: 108 mmol/L (ref 101–111)
GFR calc Af Amer: 52 mL/min — ABNORMAL LOW (ref >60)
GFR, EST NON AFRICAN AMERICAN: 45 mL/min — AB (ref >60)
GLUCOSE: 113 mg/dL — AB (ref 65–99)
Potassium: 3.4 mmol/L — ABNORMAL LOW (ref 3.5–5.1)
Sodium: 145 mmol/L (ref 135–145)
Total Bilirubin: 0.4 mg/dL (ref 0.3–1.2)
Total Protein: 7.2 g/dL (ref 6.5–8.1)

## 2015-04-10 LAB — CBC WITH DIFFERENTIAL/PLATELET
Basophils Absolute: 0.1 10*3/uL (ref 0–0.1)
Basophils Relative: 1 %
EOS ABS: 0.1 10*3/uL (ref 0–0.7)
Eosinophils Relative: 1 %
HEMATOCRIT: 34.1 % — AB (ref 35.0–47.0)
HEMOGLOBIN: 11.3 g/dL — AB (ref 12.0–16.0)
LYMPHS PCT: 27 %
Lymphs Abs: 1.7 10*3/uL (ref 1.0–3.6)
MCH: 28.6 pg (ref 26.0–34.0)
MCHC: 33 g/dL (ref 32.0–36.0)
MCV: 86.6 fL (ref 80.0–100.0)
MONO ABS: 0.6 10*3/uL (ref 0.2–0.9)
MONOS PCT: 9 %
Neutro Abs: 3.9 10*3/uL (ref 1.4–6.5)
Neutrophils Relative %: 62 %
Platelets: 252 10*3/uL (ref 150–440)
RBC: 3.94 MIL/uL (ref 3.80–5.20)
RDW: 14 % (ref 11.5–14.5)
WBC: 6.4 10*3/uL (ref 3.6–11.0)

## 2015-04-10 LAB — ETHANOL

## 2015-04-10 NOTE — ED Notes (Signed)
Ems from home. Unsure why pt here. Per ems pt hx of dementia, had argument with family, and then was complaining of abd pain. Pt not complaining of pain now. Seems to be at baseline.
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
## 2015-04-10 NOTE — Discharge Instructions (Signed)
Dementia  Please follow up with the [facility] team tomorrow morning. They will plan to pick it up from your residence and to eat to their day program.  Return to the right away if he develops severe confusion, fall, develop a fever, vomiting, severe pain, or other new concerns or symptoms arise.  Dementia is a word that is used to describe problems with the brain and how it works. People with dementia have memory loss. They may also have problems with thinking, speaking, or solving problems. It can affect how they act around people, how they do their job, their mood, and their personality. These changes may not show up for a long time. Family or friends may not notice problems in the early part of this disease. HOME CARE The following tips are for the person living with, or caring for, the person with dementia. Make the home safe.  Remove locks on bathroom doors.  Use childproof locks on cabinets where alcohol, cleaning supplies, or chemicals are stored.  Put outlet covers in electrical outlets.  Put in childproof locks to keep doors and windows safe.  Remove stove knobs, or put in safety knobs that shut off on their own.  Lower the temperature on water heaters.  Label medicines. Lock them in a safe place.  Keep knives, lighters, matches, power tools, and guns out of reach or in a safe place.  Remove objects that might break or can hurt the person.  Make sure lighting is good inside and outside.  Put in grab bars if needed.  Use a device that detects falls or other needs for help. Lessen confusion.  Keep familiar objects and people around.  Use night lights or low lit (dim) lights at night.  Label objects or areas.  Use reminders, notes, or directions for daily activities or tasks.  Keep a simple routine that is the same for waking, meals, bathing, dressing, and bedtime.  Create a calm and quiet home.  Put up clocks and calendars.  Keep emergency numbers and the home  address near all phones.  Help show the different times of day. Open the curtains during the day to let light in. Speak clearly and directly.  Choose simple words and short sentences.  Use a gentle, calm voice.  Do not interrupt.  If the person has a hard time finding a word to use, give them the word or thought.  Ask 1 question at a time. Give enough time for the person to answer. Repeat the question if the person does not answer. Do things that lessen restlessness.  Provide a comfortable bed.  Have the same bedtime routine every night.  Have a regular walking and activity schedule.  Lessen naps during the day.  Do not let the person drink a lot of caffeine.  Go to events that are not overwhelming. Eat well and drink fluids.  Lessen distractions during meal times and snacks.  Avoid foods that are too hot or too cold.  Watch how the person chews and swallows. This is to make sure they do not choke. Other  Keep all vision, hearing, dental, and medical visits with the doctor.  Only give medicines as told by the doctor.  Watch the person's driving ability. Do not let the person drive if he or she cannot drive safely.  Use a program that helps find a person if they become missing. You may need to register with this program. GET HELP RIGHT AWAY IF:   A fever of 102  F (38.9 C) develops.  Confusion develops or gets worse.  Sleepiness develops or gets worse.  Staying awake is hard to do.  New behavior problems start like mood swings, aggression, and seeing things that are not there.  Problems with balance, speech, or falling develop.  Problems swallowing develop.  Any problems of another sickness develop. MAKE SURE YOU:  Understand these instructions.  Will watch his or her condition.  Will get help right away if he or she is not doing well or gets worse. Document Released: 10/31/2008 Document Revised: 02/10/2012 Document Reviewed: 04/15/2011 [facility]  Patient Information 2015 [place], [place]. This information is not intended to replace advice given to you by your health care provider. Make sure you discuss any questions you have with your health care provider.
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
## 2015-06-01 ENCOUNTER — Other Ambulatory Visit: Payer: Self-pay

## 2015-06-01 ENCOUNTER — Encounter: Payer: Self-pay | Admitting: Emergency Medicine

## 2015-06-01 ENCOUNTER — Emergency Department
Admission: EM | Admit: 2015-06-01 | Discharge: 2015-06-01 | Disposition: A | Discharge status: Home / Self Care | Payer: Medicare (Managed Care) | Attending: Emergency Medicine | Admitting: Emergency Medicine

## 2015-06-01 DIAGNOSIS — I509 Heart failure, unspecified: Secondary | ICD-10-CM | POA: Diagnosis not present

## 2015-06-01 DIAGNOSIS — Z043 Encounter for examination and observation following other accident: Secondary | ICD-10-CM | POA: Insufficient documentation

## 2015-06-01 DIAGNOSIS — Z79899 Other long term (current) drug therapy: Secondary | ICD-10-CM | POA: Diagnosis not present

## 2015-06-01 DIAGNOSIS — Y9389 Activity, other specified: Secondary | ICD-10-CM | POA: Diagnosis not present

## 2015-06-01 DIAGNOSIS — Y92129 Unspecified place in nursing home as the place of occurrence of the external cause: Secondary | ICD-10-CM | POA: Diagnosis not present

## 2015-06-01 DIAGNOSIS — Z7982 Long term (current) use of aspirin: Secondary | ICD-10-CM | POA: Insufficient documentation

## 2015-06-01 DIAGNOSIS — I2510 Atherosclerotic heart disease of native coronary artery without angina pectoris: Secondary | ICD-10-CM | POA: Insufficient documentation

## 2015-06-01 DIAGNOSIS — W1839XA Other fall on same level, initial encounter: Secondary | ICD-10-CM | POA: Diagnosis not present

## 2015-06-01 DIAGNOSIS — I422 Other hypertrophic cardiomyopathy: Secondary | ICD-10-CM | POA: Diagnosis not present

## 2015-06-01 DIAGNOSIS — I110 Hypertensive heart disease with heart failure: Secondary | ICD-10-CM | POA: Insufficient documentation

## 2015-06-01 DIAGNOSIS — F0390 Unspecified dementia without behavioral disturbance: Secondary | ICD-10-CM | POA: Insufficient documentation

## 2015-06-01 DIAGNOSIS — W19XXXA Unspecified fall, initial encounter: Secondary | ICD-10-CM

## 2015-06-01 DIAGNOSIS — Y998 Other external cause status: Secondary | ICD-10-CM | POA: Diagnosis not present

## 2015-06-01 NOTE — ED Provider Notes (Signed)
[facility] Emergency Department Provider Note  ____________________________________________  Time seen: Approximately 6:31 PM  I have reviewed the triage vital signs and the nursing notes.   HISTORY  Chief Complaint Fall  Patient has a history of dementia  HPI Charlotte Schultz is a 81 y.o. female with multiple chronic medical problems including dementia who presents after reportedly having a mechanical fall while she was getting herself dressed.  She remembers the fall and denies losing consciousness, hitting her head, or sustaining any injury.  She did not want to come to the emergency department but her facility [facility]) sent her according to their policy.She denies any pain or discomfort   Past Medical History  Diagnosis Date  . Hypertension   . Infectious colitis   . HLD (hyperlipidemia)   . Hypertensive cardiomyopathy     a. 09/2014 Echo: EF 50-55%, sev LVH, mod AI, mild MR.  . Ovarian cyst   . CAD (coronary artery disease)     a. unclear hx.  . Dementia   . Moderate aortic insufficiency     a. 09/2014 Echo: mod AI.  Marland Kitchen Syncope     a. 09/2014 - normal EEG, nl LV fxn by echo, neg Heat CT.  Marland Kitchen LVH (left ventricular hypertrophy) due to hypertensive disease   . Hypertensive hypertrophic cardiomyopathy, with heart failure     a. echo 10/2014: EF 50-55%, elevated LV & LA end diastlic pressures, impaired relaxation, severe LVH, severely dilated LA, moderately dilated RA, small global pericardial effusion, mild-mod MR, mild-mod AI, Ao scl w/o stenosis, mild TR, hypertensive cardiomyopathy  . Failure to thrive in adult     Patient Active Problem List   Diagnosis Date Noted  . Hypertensive hypertrophic cardiomyopathy, with heart failure   . Failure to thrive in adult   . Hypertension   . Infectious colitis   . HLD (hyperlipidemia)   . Hypertensive cardiomyopathy   . Ovarian cyst   . CAD (coronary artery disease)   . Dementia   . Moderate  aortic insufficiency   . Syncope   . LVH (left ventricular hypertrophy) due to hypertensive disease     History reviewed. No pertinent past surgical history.  Current Outpatient Rx  Name  Route  Sig  Dispense  Refill  . amLODipine (NORVASC) 10 MG tablet   Oral   Take 1 tablet (10 mg total) by mouth daily.   30 tablet   6   . aspirin EC 81 MG tablet   Oral   Take 81 mg by mouth daily.         Marland Kitchen atorvastatin (LIPITOR) 20 MG tablet   Oral   Take 20 mg by mouth at bedtime.          . docusate sodium (COLACE) 100 MG capsule   Oral   Take 100 mg by mouth daily.         . feeding supplement, ENSURE COMPLETE, (ENSURE COMPLETE) LIQD   Oral   Take 237 mLs by mouth 2 (two) times daily between meals.   6 Bottle   4   . hydrALAZINE (APRESOLINE) 10 MG tablet   Oral   Take 1 tablet (10 mg total) by mouth 3 (three) times daily. Patient taking differently: Take 10 mg by mouth daily.    90 tablet   6   . hydrochlorothiazide (HYDRODIURIL) 25 MG tablet   Oral   Take 12.5 mg by mouth daily.         Marland Kitchen  Vitamin D, Ergocalciferol, (DRISDOL) 50000 UNITS CAPS capsule   Oral   Take 50,000 Units by mouth once a week. Pt takes on Monday.           Allergies Zetia and Aspirin  Family History  Problem Relation Age of Onset  . Family history unknown: Yes    Social History History  Substance Use Topics  . Smoking status: Never Smoker   . Smokeless tobacco: Not on file  . Alcohol Use: No    Review of Systems Constitutional: No fever/chills Eyes: No visual changes. ENT: No sore throat. Cardiovascular: Denies chest pain. Respiratory: Denies shortness of breath. Gastrointestinal: No abdominal pain.  No nausea, no vomiting.  No diarrhea.  No constipation. Genitourinary: Negative for dysuria. Musculoskeletal: Negative for back pain. Skin: Negative for rash. Neurological: Negative for headaches, focal weakness or numbness.  10-point ROS otherwise  negative.  ____________________________________________   PHYSICAL EXAM:  VITAL SIGNS: ED Triage Vitals  Enc Vitals Group     BP 06/01/15 1825 186/48 mmHg     Pulse Rate 06/01/15 1824 78     Resp 06/01/15 1824 18     Temp 06/01/15 1824 97.4 F (36.3 C)     Temp Source 06/01/15 1824 Oral     SpO2 06/01/15 1824 100 %     Weight --      Height --      Head Cir --      Peak Flow --      Pain Score --      Pain Loc --      Pain Edu? --      Excl. in GC? --     Constitutional: Alert and oriented. Well appearing and in no acute distress. Eyes: Conjunctivae are normal. PERRL. EOMI. Head: Atraumatic. Nose: No congestion/rhinnorhea. Mouth/Throat: Mucous membranes are moist.  Oropharynx non-erythematous. Neck: No stridor.  No cervical spine tenderness to palpation. Cardiovascular: Normal rate, regular rhythm. Grossly normal heart sounds.  Good peripheral circulation. Respiratory: Normal respiratory effort.  No retractions. Lungs CTAB. Gastrointestinal: Soft and nontender. No distention. No abdominal bruits. No CVA tenderness. Musculoskeletal: No lower extremity tenderness nor edema.  No joint effusions. Neurologic:  Normal speech and language. No gross focal neurologic deficits are appreciated. Speech is normal. Skin:  Skin is warm, dry and intact. No rash noted. Psychiatric: Mood and affect are normal. Speech and behavior are normal.  ____________________________________________   LABS (all labs ordered are listed, but only abnormal results are displayed)  Not indicated ____________________________________________  EKG  ED ECG REPORT I, Lakysha Kossman, the attending physician, personally viewed and interpreted this ECG.   Date: 06/01/2015  EKG Time: 19:13  Rate: 79  Rhythm: Normal  Axis: Normal  Intervals:Normal  ST&T Change: None  ____________________________________________  RADIOLOGY  Not  indicated  ____________________________________________   PROCEDURES  Procedure(s) performed: None  Critical Care performed: No ____________________________________________   INITIAL IMPRESSION / ASSESSMENT AND PLAN / ED COURSE  Pertinent labs & imaging results that were available during my care of the patient were reviewed by me and considered in my medical decision making (see chart for details).  The patient was fully disrobed and examined to rule out any evidence of acute injury.  I did not find any sign of acute injury and she has no complaints.  I reviewed the patient's MAR and she does not take any blood thinners other than 81 mg aspirin.  Given her completely atraumatic exam and no pain or tenderness to palpation, I  do not feel that any imaging is indicated at this time.  She is pleasantly demented during our conversation which apparently is her baseline.  I discussed all of this with her son who is present in the emergency department and he shares my opinion and has no concern at this time. ____________________________________________  FINAL CLINICAL IMPRESSION(S) / ED DIAGNOSES  Final diagnoses:  Fall from standing, initial encounter      NEW MEDICATIONS STARTED DURING THIS VISIT:  New Prescriptions   No medications on file     Loleta Rose, MD 06/01/15 1929

## 2015-06-01 NOTE — ED Notes (Signed)
Spoke with [employer] on pt discharge disposition.

## 2015-06-01 NOTE — Discharge Instructions (Signed)
You have been seen in the Emergency Department (ED) today for a fall.  Your work up does not show any concerning injuries.  Please take over-the-counter ibuprofen and/or Tylenol as needed for your pain (unless you have an allergy or your doctor as told you not to take them), or take any prescribed medication as instructed.  Please follow up with your doctor regarding today's Emergency Department (ED) visit and your recent fall.    Return to the ED if you have any headache, confusion, slurred speech, weakness/numbness of any arm or leg, or any increased pain.   Fall Prevention and Home Safety Falls cause injuries and can affect all age groups. It is possible to use preventive measures to significantly decrease the likelihood of falls. There are many simple measures which can make your home safer and prevent falls. OUTDOORS  Repair cracks and edges of walkways and driveways.  Remove high doorway thresholds.  Trim shrubbery on the main path into your home.  Have good outside lighting.  Clear walkways of tools, rocks, debris, and clutter.  Check that handrails are not broken and are securely fastened. Both sides of steps should have handrails.  Have leaves, snow, and ice cleared regularly.  Use sand or salt on walkways during winter months.  In the garage, clean up grease or oil spills. BATHROOM  Install night lights.  Install grab bars by the toilet and in the tub and shower.  Use non-skid mats or decals in the tub or shower.  Place a plastic non-slip stool in the shower to sit on, if needed.  Keep floors dry and clean up all water on the floor immediately.  Remove soap buildup in the tub or shower on a regular basis.  Secure bath mats with non-slip, double-sided rug tape.  Remove throw rugs and tripping hazards from the floors. BEDROOMS  Install night lights.  Make sure a bedside light is easy to reach.  Do not use oversized bedding.  Keep a telephone by your  bedside.  Have a firm chair with side arms to use for getting dressed.  Remove throw rugs and tripping hazards from the floor. KITCHEN  Keep handles on pots and pans turned toward the center of the stove. Use back burners when possible.  Clean up spills quickly and allow time for drying.  Avoid walking on wet floors.  Avoid hot utensils and knives.  Position shelves so they are not too high or low.  Place commonly used objects within easy reach.  If necessary, use a sturdy step stool with a grab bar when reaching.  Keep electrical cables out of the way.  Do not use floor polish or wax that makes floors slippery. If you must use wax, use non-skid floor wax.  Remove throw rugs and tripping hazards from the floor. STAIRWAYS  Never leave objects on stairs.  Place handrails on both sides of stairways and use them. Fix any loose handrails. Make sure handrails on both sides of the stairways are as long as the stairs.  Check carpeting to make sure it is firmly attached along stairs. Make repairs to worn or loose carpet promptly.  Avoid placing throw rugs at the top or bottom of stairways, or properly secure the rug with carpet tape to prevent slippage. Get rid of throw rugs, if possible.  Have an electrician put in a light switch at the top and bottom of the stairs. OTHER FALL PREVENTION TIPS  Wear low-heel or rubber-soled shoes that are supportive and  fit well. Wear closed toe shoes.  When using a stepladder, make sure it is fully opened and both spreaders are firmly locked. Do not climb a closed stepladder.  Add color or contrast paint or tape to grab bars and handrails in your home. Place contrasting color strips on first and last steps.  Learn and use mobility aids as needed. Install an electrical emergency response system.  Turn on lights to avoid dark areas. Replace light bulbs that burn out immediately. Get light switches that glow.  Arrange furniture to create clear  pathways. Keep furniture in the same place.  Firmly attach carpet with non-skid or double-sided tape.  Eliminate uneven floor surfaces.  Select a carpet pattern that does not visually hide the edge of steps.  Be aware of all pets. OTHER HOME SAFETY TIPS  Set the water temperature for 120 F (48.8 C).  Keep emergency numbers on or near the telephone.  Keep smoke detectors on every level of the home and near sleeping areas. Document Released: 11/08/2002 Document Revised: 05/19/2012 Document Reviewed: 02/07/2012 [facility] Patient Information 2015 [place], [place]. This information is not intended to replace advice given to you by your health care provider. Make sure you discuss any questions you have with your health care provider.  Fall Prevention and Home Safety Falls cause injuries and can affect all age groups. It is possible to use preventive measures to significantly decrease the likelihood of falls. There are many simple measures which can make your home safer and prevent falls. OUTDOORS  Repair cracks and edges of walkways and driveways.  Remove high doorway thresholds.  Trim shrubbery on the main path into your home.  Have good outside lighting.  Clear walkways of tools, rocks, debris, and clutter.  Check that handrails are not broken and are securely fastened. Both sides of steps should have handrails.  Have leaves, snow, and ice cleared regularly.  Use sand or salt on walkways during winter months.  In the garage, clean up grease or oil spills. BATHROOM  Install night lights.  Install grab bars by the toilet and in the tub and shower.  Use non-skid mats or decals in the tub or shower.  Place a plastic non-slip stool in the shower to sit on, if needed.  Keep floors dry and clean up all water on the floor immediately.  Remove soap buildup in the tub or shower on a regular basis.  Secure bath mats with non-slip, double-sided rug tape.  Remove throw rugs and  tripping hazards from the floors. BEDROOMS  Install night lights.  Make sure a bedside light is easy to reach.  Do not use oversized bedding.  Keep a telephone by your bedside.  Have a firm chair with side arms to use for getting dressed.  Remove throw rugs and tripping hazards from the floor. KITCHEN  Keep handles on pots and pans turned toward the center of the stove. Use back burners when possible.  Clean up spills quickly and allow time for drying.  Avoid walking on wet floors.  Avoid hot utensils and knives.  Position shelves so they are not too high or low.  Place commonly used objects within easy reach.  If necessary, use a sturdy step stool with a grab bar when reaching.  Keep electrical cables out of the way.  Do not use floor polish or wax that makes floors slippery. If you must use wax, use non-skid floor wax.  Remove throw rugs and tripping hazards from the floor. STAIRWAYS  Never leave objects on stairs.  Place handrails on both sides of stairways and use them. Fix any loose handrails. Make sure handrails on both sides of the stairways are as long as the stairs.  Check carpeting to make sure it is firmly attached along stairs. Make repairs to worn or loose carpet promptly.  Avoid placing throw rugs at the top or bottom of stairways, or properly secure the rug with carpet tape to prevent slippage. Get rid of throw rugs, if possible.  Have an electrician put in a light switch at the top and bottom of the stairs. OTHER FALL PREVENTION TIPS  Wear low-heel or rubber-soled shoes that are supportive and fit well. Wear closed toe shoes.  When using a stepladder, make sure it is fully opened and both spreaders are firmly locked. Do not climb a closed stepladder.  Add color or contrast paint or tape to grab bars and handrails in your home. Place contrasting color strips on first and last steps.  Learn and use mobility aids as needed. Install an electrical  emergency response system.  Turn on lights to avoid dark areas. Replace light bulbs that burn out immediately. Get light switches that glow.  Arrange furniture to create clear pathways. Keep furniture in the same place.  Firmly attach carpet with non-skid or double-sided tape.  Eliminate uneven floor surfaces.  Select a carpet pattern that does not visually hide the edge of steps.  Be aware of all pets. OTHER HOME SAFETY TIPS  Set the water temperature for 120 F (48.8 C).  Keep emergency numbers on or near the telephone.  Keep smoke detectors on every level of the home and near sleeping areas. Document Released: 11/08/2002 Document Revised: 05/19/2012 Document Reviewed: 02/07/2012 [facility] Patient Information 2015 [place], [place]. This information is not intended to replace advice given to you by your health care provider. Make sure you discuss any questions you have with your health care provider.

## 2015-06-01 NOTE — ED Notes (Signed)
Patient and family with no complaints at this time. Respirations even and unlabored. Skin warm/dry. Discharge instructions reviewed with patient and family at this time. Patient and family given opportunity to voice concerns/ask questions. Patient discharged at this time and left Emergency Department, via wheelchair.   

## 2015-06-01 NOTE — ED Notes (Signed)
Pt comes in via EMS with complaints of a fall at [place] house where she resides.  Patient denies LOC, hitting her head, or injuring herself at all.  Patient denies pain.  Facility wanted patient to be medically checked.

## 2015-06-17 IMAGING — CT CT HEAD W/O CM
1 series · 16 of 30 positions shown, 20 images · non-contrast
Comparison: 09/08/2014

CLINICAL DATA: Disorientation

EXAM:
CT HEAD WITHOUT CONTRAST
TECHNIQUE: Contiguous axial images were obtained from the base of the skull
through the vertex without intravenous contrast.

[Series 2: head wo · axial · 0.39mm/px · z∈[-123,+3]mm · 16 of 32 slices shown, 20 images]
[im 2/32  brain]
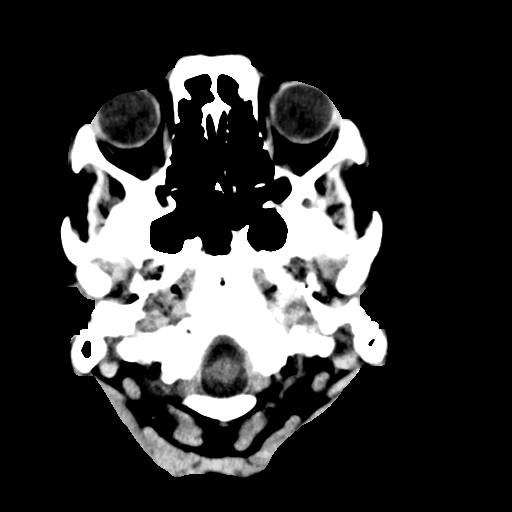
[im 2/32  bone]
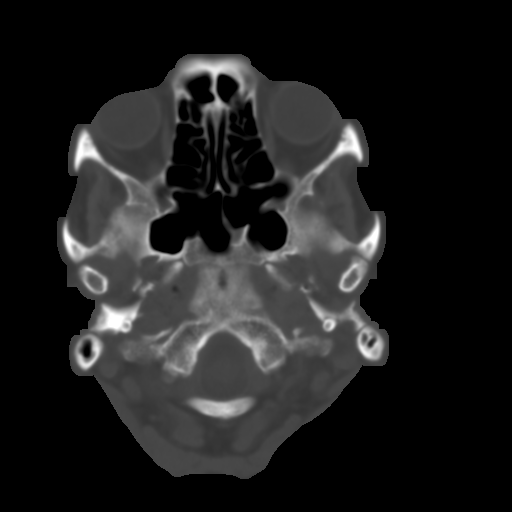
[im 4/32  brain]
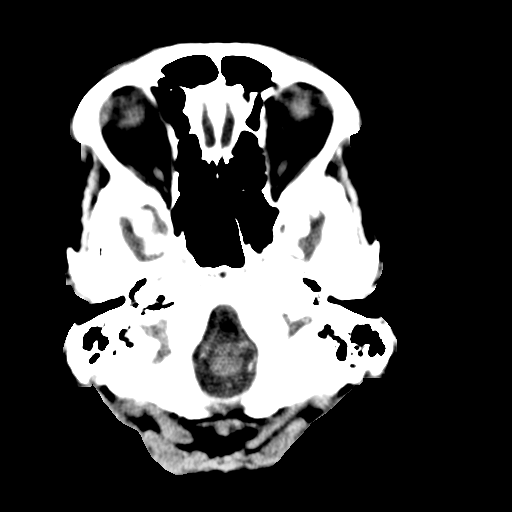
[im 6/32  brain]
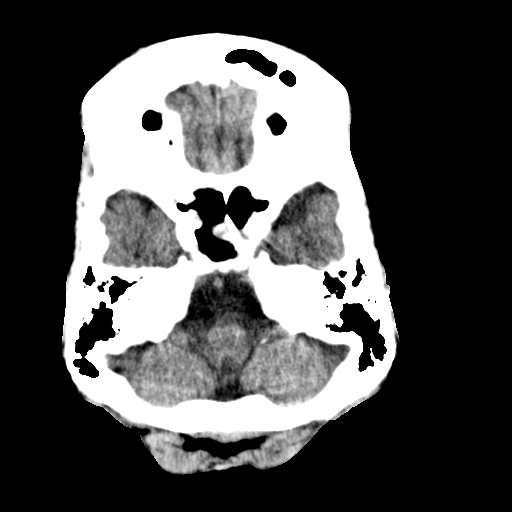
[im 8/32  brain]
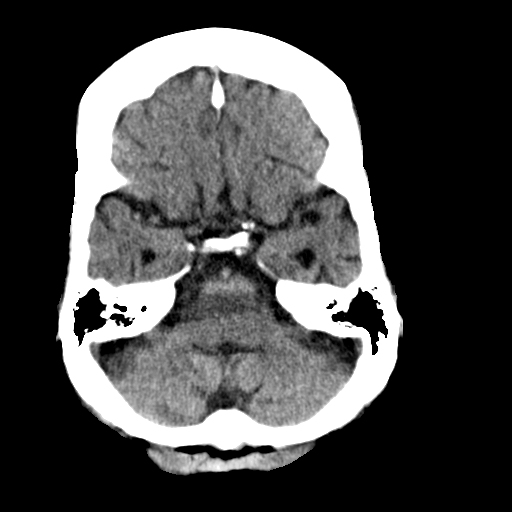
[im 9/32  brain]
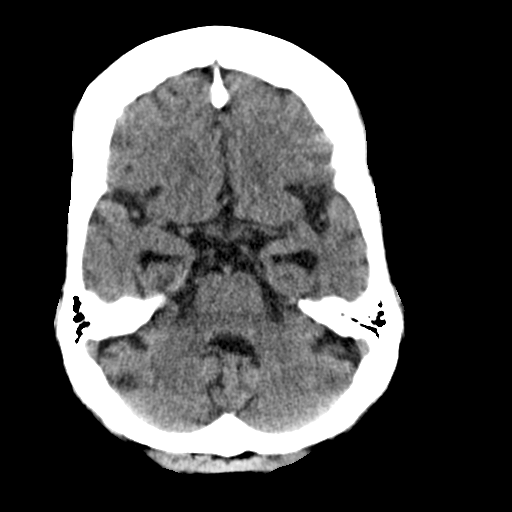
[im 9/32  bone]
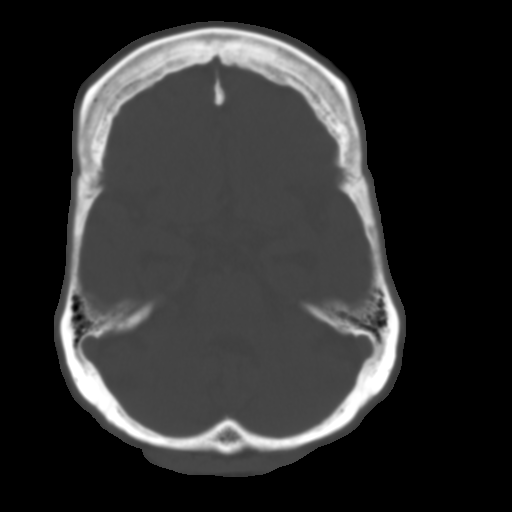
[im 11/32  brain]
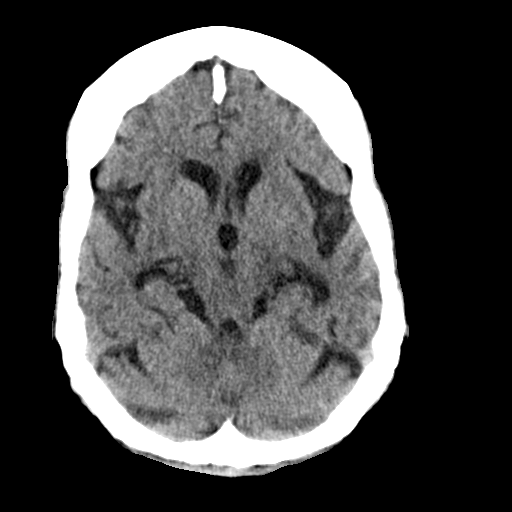
[im 13/32  brain]
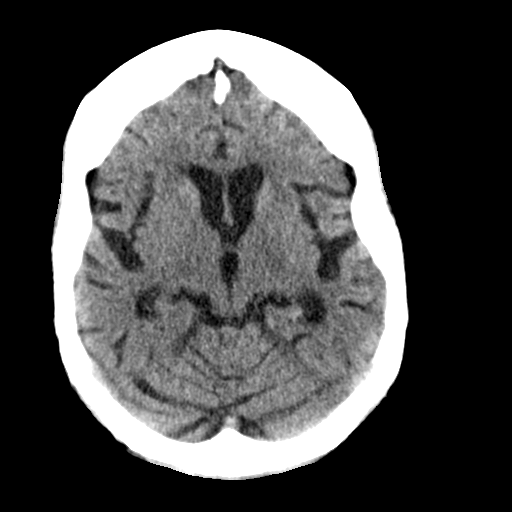
[im 15/32  brain]
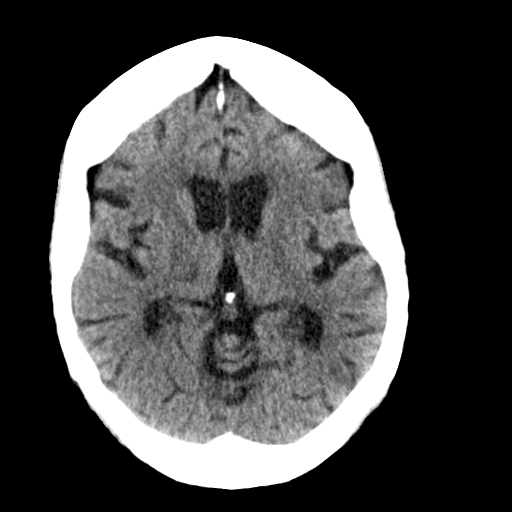
[im 17/32  brain]
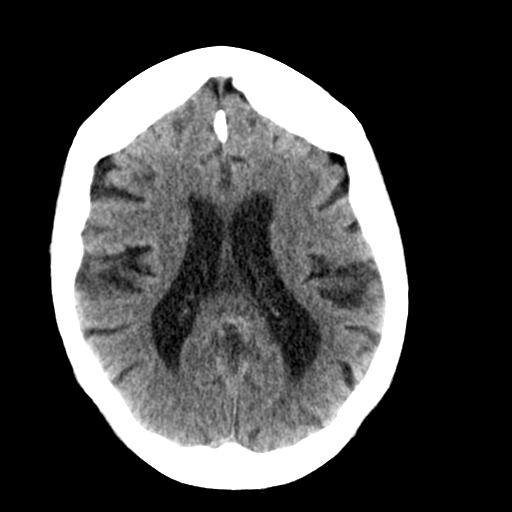
[im 17/32  bone]
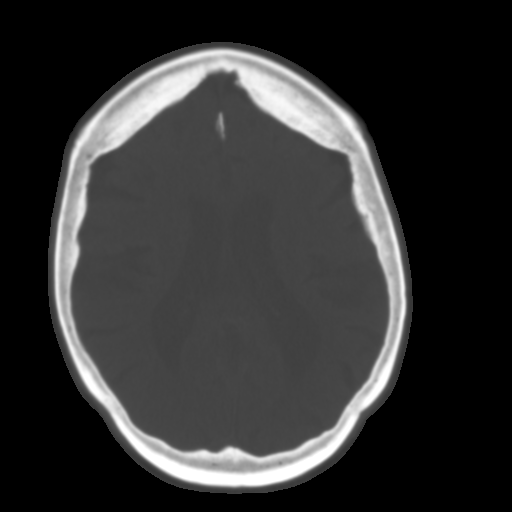
[im 19/32  brain]
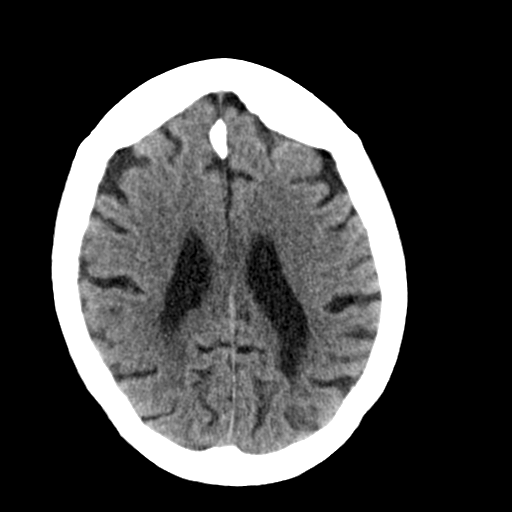
[im 21/32  brain]
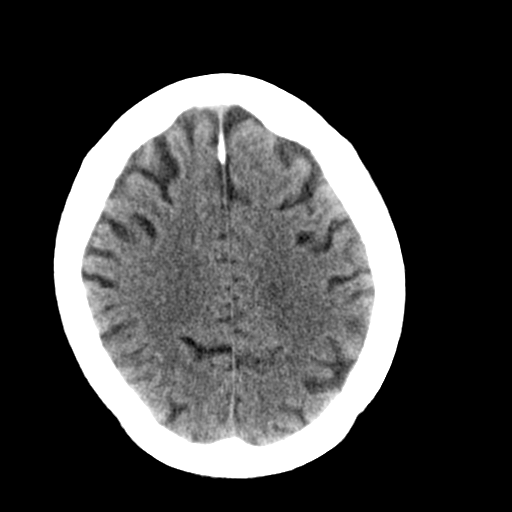
[im 23/32  brain]
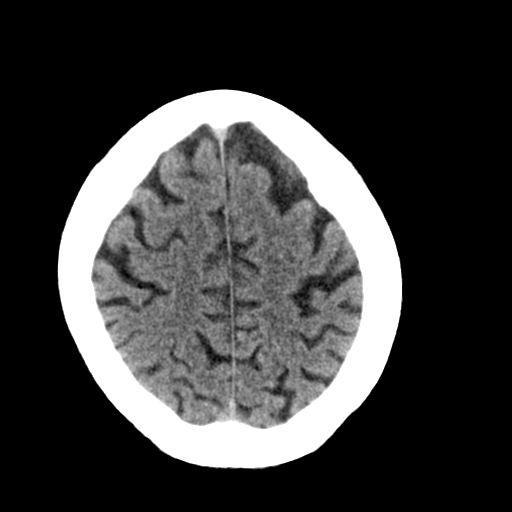
[im 24/32  brain]
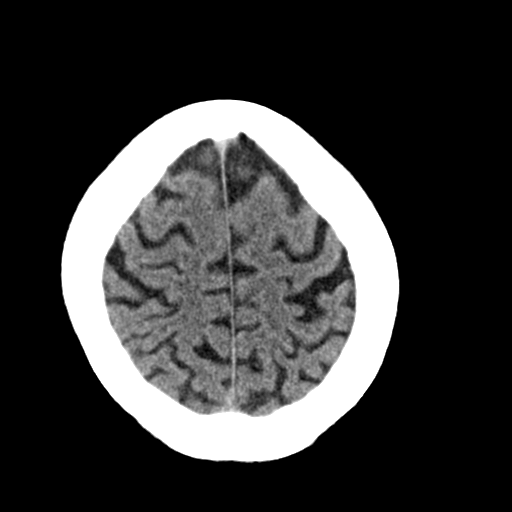
[im 24/32  bone]
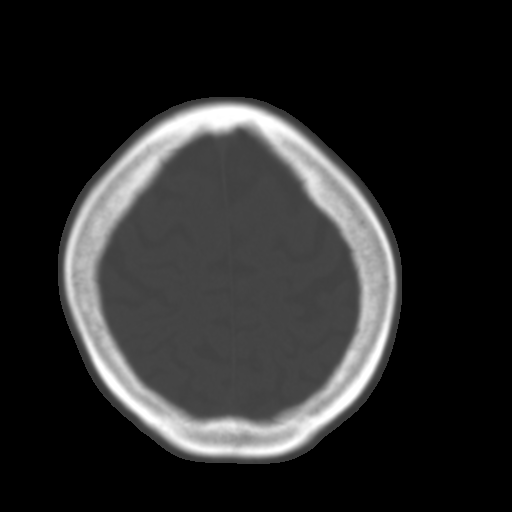
[im 26/32  brain]
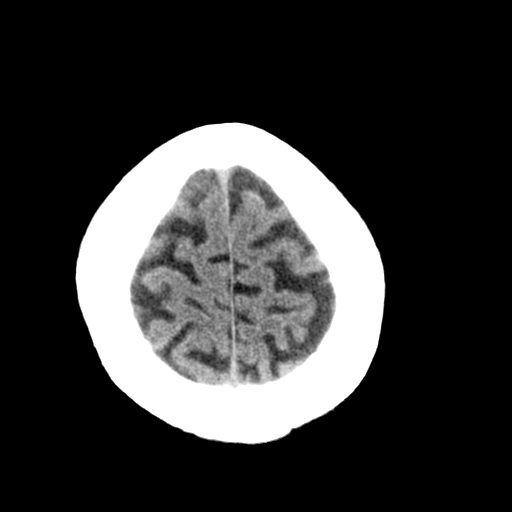
[im 28/32  brain]
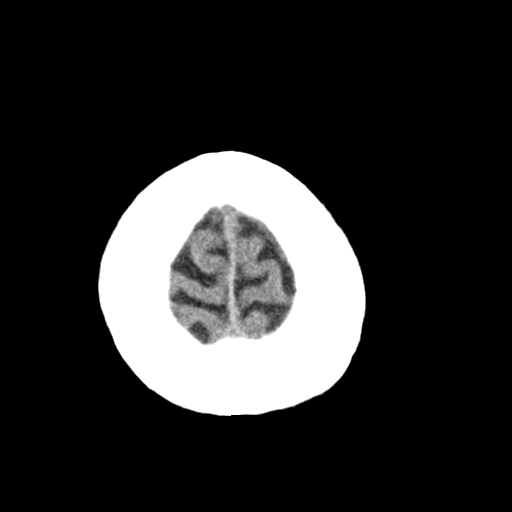
[im 30/32  brain]
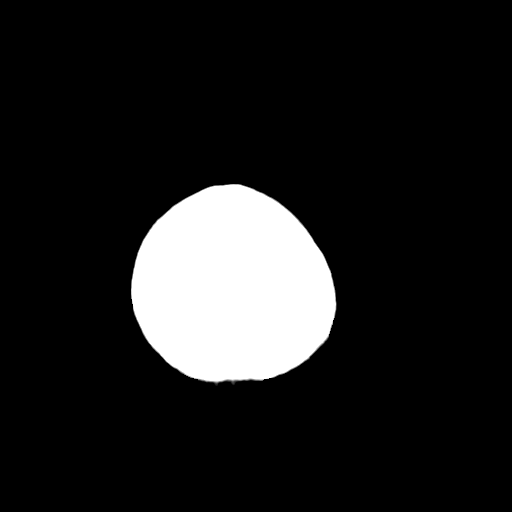

[16 of 30 positions shown; findings below may reference images not displayed]

FINDINGS: There is no intracranial hemorrhage, mass or evidence of acute
infarction. There is no extra-axial fluid collection. There is
moderately severe generalized atrophy. There is moderate hemispheric
white matter hypodensity consistent with chronic small vessel
ischemic disease. Brainstem and posterior fossa are remarkable only
for mild symmetric atrophy.

No bony abnormalities are evident. The visible paranasal sinuses are
clear.
IMPRESSION: Moderately severe atrophy and chronic small vessel disease. No acute
intracranial findings.

## 2015-06-21 ENCOUNTER — Encounter: Payer: Self-pay | Admitting: *Deleted

## 2015-06-21 ENCOUNTER — Other Ambulatory Visit: Payer: Self-pay

## 2015-06-21 ENCOUNTER — Emergency Department
Admission: EM | Admit: 2015-06-21 | Discharge: 2015-06-21 | Disposition: A | Discharge status: Home / Self Care | Payer: Medicare Other | Attending: Emergency Medicine | Admitting: Emergency Medicine

## 2015-06-21 DIAGNOSIS — Z7982 Long term (current) use of aspirin: Secondary | ICD-10-CM | POA: Diagnosis not present

## 2015-06-21 DIAGNOSIS — I501 Left ventricular failure: Secondary | ICD-10-CM | POA: Diagnosis not present

## 2015-06-21 DIAGNOSIS — Z79899 Other long term (current) drug therapy: Secondary | ICD-10-CM | POA: Insufficient documentation

## 2015-06-21 DIAGNOSIS — R400 Somnolence: Secondary | ICD-10-CM | POA: Diagnosis not present

## 2015-06-21 DIAGNOSIS — I10 Essential (primary) hypertension: Secondary | ICD-10-CM | POA: Diagnosis not present

## 2015-06-21 DIAGNOSIS — I110 Hypertensive heart disease with heart failure: Secondary | ICD-10-CM | POA: Insufficient documentation

## 2015-06-21 DIAGNOSIS — R4182 Altered mental status, unspecified: Secondary | ICD-10-CM | POA: Diagnosis present

## 2015-06-21 LAB — URINALYSIS COMPLETE WITH MICROSCOPIC (ARMC ONLY)
Bilirubin Urine: NEGATIVE
Glucose, UA: NEGATIVE mg/dL
Hgb urine dipstick: NEGATIVE
KETONES UR: NEGATIVE mg/dL
Leukocytes, UA: NEGATIVE
NITRITE: NEGATIVE
PROTEIN: NEGATIVE mg/dL
Specific Gravity, Urine: 1.005 (ref 1.005–1.030)
pH: 8 (ref 5.0–8.0)

## 2015-06-21 NOTE — ED Notes (Signed)
Pt arrives from [facility], EMs reports staff said pt could not be awaken, upon arrival to ER pt awake and alert, pt oriented to name and place but not year, time, or situation, pt in no distress

## 2015-06-21 NOTE — ED Provider Notes (Signed)
[facility] Emergency Department Provider Note  ____________________________________________  Time seen: 12:15 PM  I have reviewed the triage vital signs and the nursing notes.   HISTORY  Chief Complaint Altered Mental Status  history Limited by history of dementia   HPI Charlotte Schultz is a 81 y.o. female who sent to the ED from [facility] due to being unarousable. EMS report that on their arrival when he attempted to move the patient to a stretcher she woke up. Since then the patient has been awake and alert and denying any symptoms.  To me the patient denies any symptoms. She reports that she was tired and went to sleep. No fever chills chest pain shortness of breath back pain abdominal pain nausea vomiting diarrhea. She reports she is eating and drinking normally. She is ambulatory. Denies any dysuria frequency or urgency out of the ordinary. No other acute symptoms. No history of fall or trauma    Past Medical History  Diagnosis Date  . Hypertension   . Infectious colitis   . HLD (hyperlipidemia)   . Hypertensive cardiomyopathy     a. 09/2014 Echo: EF 50-55%, sev LVH, mod AI, mild MR.  . Ovarian cyst   . CAD (coronary artery disease)     a. unclear hx.  . Dementia   . Moderate aortic insufficiency     a. 09/2014 Echo: mod AI.  Marland Kitchen Syncope     a. 09/2014 - normal EEG, nl LV fxn by echo, neg Heat CT.  Marland Kitchen LVH (left ventricular hypertrophy) due to hypertensive disease   . Hypertensive hypertrophic cardiomyopathy, with heart failure     a. echo 10/2014: EF 50-55%, elevated LV & LA end diastlic pressures, impaired relaxation, severe LVH, severely dilated LA, moderately dilated RA, small global pericardial effusion, mild-mod MR, mild-mod AI, Ao scl w/o stenosis, mild TR, hypertensive cardiomyopathy  . Failure to thrive in adult     Patient Active Problem List   Diagnosis Date Noted  . Hypertensive hypertrophic cardiomyopathy, with heart failure   .  Failure to thrive in adult   . Hypertension   . Infectious colitis   . HLD (hyperlipidemia)   . Hypertensive cardiomyopathy   . Ovarian cyst   . CAD (coronary artery disease)   . Dementia   . Moderate aortic insufficiency   . Syncope   . LVH (left ventricular hypertrophy) due to hypertensive disease     History reviewed. No pertinent past surgical history.  Current Outpatient Rx  Name  Route  Sig  Dispense  Refill  . amLODipine (NORVASC) 10 MG tablet   Oral   Take 1 tablet (10 mg total) by mouth daily.   30 tablet   6   . aspirin EC 81 MG tablet   Oral   Take 81 mg by mouth daily.         Marland Kitchen atorvastatin (LIPITOR) 20 MG tablet   Oral   Take 20 mg by mouth at bedtime.          . docusate sodium (COLACE) 100 MG capsule   Oral   Take 100 mg by mouth daily.         . feeding supplement, ENSURE COMPLETE, (ENSURE COMPLETE) LIQD   Oral   Take 237 mLs by mouth 2 (two) times daily between meals.   6 Bottle   4   . hydrALAZINE (APRESOLINE) 10 MG tablet   Oral   Take 1 tablet (10 mg total) by mouth 3 (three)  times daily. Patient taking differently: Take 10 mg by mouth daily.    90 tablet   6   . hydrochlorothiazide (HYDRODIURIL) 25 MG tablet   Oral   Take 12.5 mg by mouth daily.         . Vitamin D, Ergocalciferol, (DRISDOL) 50000 UNITS CAPS capsule   Oral   Take 50,000 Units by mouth once a week. Pt takes on Monday.           Allergies Zetia and Aspirin  Family History  Problem Relation Age of Onset  . Family history unknown: Yes    Social History History  Substance Use Topics  . Smoking status: Never Smoker   . Smokeless tobacco: Not on file  . Alcohol Use: No    Review of Systems  Constitutional: No fever or chills. No weight changes Eyes:No blurry vision or double vision.  ENT: No sore throat. Cardiovascular: No chest pain. Respiratory: No dyspnea or cough. Gastrointestinal: Negative for abdominal pain, vomiting and diarrhea.  No  BRBPR or melena. Genitourinary: Negative for dysuria, urinary retention, bloody urine, or difficulty urinating. Musculoskeletal: Negative for back pain. No joint swelling or pain. Skin: Negative for rash. Neurological: Negative for headaches, focal weakness or numbness. Psychiatric:No anxiety or depression.   Endocrine:No hot/cold intolerance, changes in energy, or sleep difficulty.  10-point ROS otherwise negative.  ____________________________________________   PHYSICAL EXAM:  VITAL SIGNS: ED Triage Vitals  Enc Vitals Group     BP 06/21/15 1206 192/54 mmHg     Pulse Rate 06/21/15 1206 78     Resp 06/21/15 1206 18     Temp 06/21/15 1206 98.4 F (36.9 C)     Temp Source 06/21/15 1206 Oral     SpO2 06/21/15 1206 99 %     Weight 06/21/15 1206 110 lb (49.896 kg)     Height 06/21/15 1206 5\' 5"  (1.651 m)     Head Cir --      Peak Flow --      Pain Score --      Pain Loc --      Pain Edu? --      Excl. in GC? --      Constitutional: Alert and oriented to person and place. Well appearing and in no distress. Patient is upset that she was sent to the ED and that staff insisted that there was something wrong with her. She insists that she is asymptomatic and feels perfectly normal. Eyes: No scleral icterus. No conjunctival pallor. PERRL. EOMI ENT   Head: Normocephalic and atraumatic.   Nose: No congestion/rhinnorhea. No septal hematoma   Mouth/Throat: MMM, no pharyngeal erythema. No peritonsillar mass. No uvula shift.   Neck: No stridor. No SubQ emphysema. No meningismus. Hematological/Lymphatic/Immunilogical: No cervical lymphadenopathy. Cardiovascular: RRR. Normal and symmetric distal pulses are present in all extremities. No murmurs, rubs, or gallops. Respiratory: Normal respiratory effort without tachypnea nor retractions. Breath sounds are clear and equal bilaterally. No wheezes/rales/rhonchi. Gastrointestinal: Soft and nontender. No distention. There is no CVA  tenderness.  No rebound, rigidity, or guarding. Genitourinary: deferred Musculoskeletal: Nontender with normal range of motion in all extremities. No joint effusions.  No lower extremity tenderness.  No edema. Neurologic:   Normal speech and language.  CN 2-10 normal. Motor grossly intact. No pronator drift.  Normal gait. No gross focal neurologic deficits are appreciated.  Skin:  Skin is warm, dry and intact. No rash noted.  No petechiae, purpura, or bullae. Psychiatric: Mood and affect are normal.  Speech and behavior are normal. Patient exhibits appropriate insight and judgment.  ____________________________________________    LABS (pertinent positives/negatives) (all labs ordered are listed, but only abnormal results are displayed) Labs Reviewed  URINALYSIS COMPLETEWITH MICROSCOPIC ([facility] ONLY) - Abnormal; Notable for the following:    Color, Urine STRAW (*)    APPearance CLEAR (*)    Bacteria, UA RARE (*)    Squamous Epithelial / LPF 0-5 (*)    All other components within normal limits   ____________________________________________   EKG  Interpreted by me Normal sinus rhythm rate of 73, left axis, normal intervals, poor R-wave progression in anterior precordial leads, normal ST segments and T waves.  ____________________________________________    RADIOLOGY    ____________________________________________   PROCEDURES  ____________________________________________   INITIAL IMPRESSION / ASSESSMENT AND PLAN / ED COURSE  Pertinent labs & imaging results that were available during my care of the patient were reviewed by me and considered in my medical decision making (see chart for details).  Patient is awake alert, interacts appropriately, no acute distress. She denies any symptoms whatsoever and her exam is reassuring. Due to a history of chronic frequency which she reports is her baseline, we'll check a urinalysis, but no other tests appear to be indicated at  this time. The patient states that she is hungry and would like to eat, and is tolerating oral intake.  ----------------------------------------- 12:54 PM on 06/21/2015 -----------------------------------------  Urinalysis normal. We'll discharge home. It appears that she was sleeping and has no acute complaints or medical illness. She is medically stable.  ____________________________________________   FINAL CLINICAL IMPRESSION(S) / ED DIAGNOSES  Final diagnoses:  Somnolence, daytime      Sharman Cheek, MD 06/21/15 1254

## 2015-10-09 ENCOUNTER — Emergency Department
Admission: EM | Admit: 2015-10-09 | Discharge: 2015-10-09 | Disposition: A | Discharge status: Home / Self Care | Attending: Emergency Medicine | Admitting: Emergency Medicine

## 2015-10-09 ENCOUNTER — Emergency Department

## 2015-10-09 DIAGNOSIS — S79912A Unspecified injury of left hip, initial encounter: Secondary | ICD-10-CM | POA: Diagnosis not present

## 2015-10-09 DIAGNOSIS — I509 Heart failure, unspecified: Secondary | ICD-10-CM | POA: Diagnosis not present

## 2015-10-09 DIAGNOSIS — Y92002 Bathroom of unspecified non-institutional (private) residence single-family (private) house as the place of occurrence of the external cause: Secondary | ICD-10-CM | POA: Insufficient documentation

## 2015-10-09 DIAGNOSIS — W1839XA Other fall on same level, initial encounter: Secondary | ICD-10-CM | POA: Insufficient documentation

## 2015-10-09 DIAGNOSIS — I110 Hypertensive heart disease with heart failure: Secondary | ICD-10-CM | POA: Insufficient documentation

## 2015-10-09 DIAGNOSIS — Y998 Other external cause status: Secondary | ICD-10-CM | POA: Insufficient documentation

## 2015-10-09 DIAGNOSIS — F419 Anxiety disorder, unspecified: Secondary | ICD-10-CM | POA: Diagnosis not present

## 2015-10-09 DIAGNOSIS — Z7982 Long term (current) use of aspirin: Secondary | ICD-10-CM | POA: Diagnosis not present

## 2015-10-09 DIAGNOSIS — S0003XA Contusion of scalp, initial encounter: Secondary | ICD-10-CM

## 2015-10-09 DIAGNOSIS — S0990XA Unspecified injury of head, initial encounter: Secondary | ICD-10-CM | POA: Diagnosis present

## 2015-10-09 DIAGNOSIS — Z79899 Other long term (current) drug therapy: Secondary | ICD-10-CM | POA: Diagnosis not present

## 2015-10-09 DIAGNOSIS — Y9389 Activity, other specified: Secondary | ICD-10-CM | POA: Insufficient documentation

## 2015-10-09 LAB — CBC WITH DIFFERENTIAL/PLATELET
BASOS ABS: 0.1 10*3/uL (ref 0–0.1)
Basophils Relative: 1 %
EOS ABS: 0.5 10*3/uL (ref 0–0.7)
EOS PCT: 7 %
HCT: 35.1 % (ref 35.0–47.0)
Hemoglobin: 11.7 g/dL — ABNORMAL LOW (ref 12.0–16.0)
LYMPHS PCT: 18 %
Lymphs Abs: 1.4 10*3/uL (ref 1.0–3.6)
MCH: 29 pg (ref 26.0–34.0)
MCHC: 33.4 g/dL (ref 32.0–36.0)
MCV: 86.8 fL (ref 80.0–100.0)
Monocytes Absolute: 0.9 10*3/uL (ref 0.2–0.9)
Monocytes Relative: 11 %
Neutro Abs: 4.9 10*3/uL (ref 1.4–6.5)
Neutrophils Relative %: 63 %
PLATELETS: 158 10*3/uL (ref 150–440)
RBC: 4.04 MIL/uL (ref 3.80–5.20)
RDW: 14.4 % (ref 11.5–14.5)
WBC: 7.8 10*3/uL (ref 3.6–11.0)

## 2015-10-09 LAB — COMPREHENSIVE METABOLIC PANEL
ALK PHOS: 67 U/L (ref 38–126)
ALT: 11 U/L — ABNORMAL LOW (ref 14–54)
AST: 24 U/L (ref 15–41)
Albumin: 3.7 g/dL (ref 3.5–5.0)
Anion gap: 6 (ref 5–15)
BUN: 17 mg/dL (ref 6–20)
CALCIUM: 10.7 mg/dL — AB (ref 8.9–10.3)
CO2: 26 mmol/L (ref 22–32)
Chloride: 108 mmol/L (ref 101–111)
Creatinine, Ser: 1.02 mg/dL — ABNORMAL HIGH (ref 0.44–1.00)
GFR calc Af Amer: 56 mL/min — ABNORMAL LOW (ref >60)
GFR calc non Af Amer: 48 mL/min — ABNORMAL LOW (ref >60)
GLUCOSE: 109 mg/dL — AB (ref 65–99)
Potassium: 4.1 mmol/L (ref 3.5–5.1)
Sodium: 140 mmol/L (ref 135–145)
Total Bilirubin: 0.7 mg/dL (ref 0.3–1.2)
Total Protein: 6.9 g/dL (ref 6.5–8.1)

## 2015-10-09 LAB — URINALYSIS COMPLETE WITH MICROSCOPIC (ARMC ONLY)
BILIRUBIN URINE: NEGATIVE
Bacteria, UA: NONE SEEN
GLUCOSE, UA: NEGATIVE mg/dL
Hgb urine dipstick: NEGATIVE
Ketones, ur: NEGATIVE mg/dL
LEUKOCYTES UA: NEGATIVE
NITRITE: NEGATIVE
Protein, ur: NEGATIVE mg/dL
SPECIFIC GRAVITY, URINE: 1.01 (ref 1.005–1.030)
pH: 7 (ref 5.0–8.0)

## 2015-10-09 LAB — LIPASE, BLOOD: LIPASE: 37 U/L (ref 11–51)

## 2015-10-09 MED ORDER — ACETAMINOPHEN 325 MG PO TABS: 650.0000 mg | ORAL_TABLET | Freq: Four times a day (QID) | ORAL | Status: AC | PRN

## 2015-10-09 NOTE — ED Provider Notes (Signed)
[facility] Emergency Department Provider Note  ____________________________________________  Time seen: 1:25 PM on arrival by EMS  I have reviewed the triage vital signs and the nursing notes.   HISTORY  Chief Complaint Fall and Head Injury    HPI Charlotte Schultz is a 81 y.o. female brought to the ED for a fall while going to the bathroom at her nursing home. She did hit the right side of her head and complains of pain in this area. Denies neck pain. No syncope per patient. Denies any other complaints at this time.     Past Medical History  Diagnosis Date  . Hypertension   . Infectious colitis   . HLD (hyperlipidemia)   . Hypertensive cardiomyopathy (HCC)     a. 09/2014 Echo: EF 50-55%, sev LVH, mod AI, mild MR.  . Ovarian cyst   . CAD (coronary artery disease)     a. unclear hx.  . Dementia   . Moderate aortic insufficiency     a. 09/2014 Echo: mod AI.  Marland Kitchen Syncope     a. 09/2014 - normal EEG, nl LV fxn by echo, neg Heat CT.  Marland Kitchen LVH (left ventricular hypertrophy) due to hypertensive disease   . Hypertensive hypertrophic cardiomyopathy, with heart failure (HCC)     a. echo 10/2014: EF 50-55%, elevated LV & LA end diastlic pressures, impaired relaxation, severe LVH, severely dilated LA, moderately dilated RA, small global pericardial effusion, mild-mod MR, mild-mod AI, Ao scl w/o stenosis, mild TR, hypertensive cardiomyopathy  . Failure to thrive in adult      Patient Active Problem List   Diagnosis Date Noted  . Hypertensive hypertrophic cardiomyopathy, with heart failure (HCC)   . Failure to thrive in adult   . Hypertension   . Infectious colitis   . HLD (hyperlipidemia)   . Hypertensive cardiomyopathy (HCC)   . Ovarian cyst   . CAD (coronary artery disease)   . Dementia   . Moderate aortic insufficiency   . Syncope   . LVH (left ventricular hypertrophy) due to hypertensive disease      History reviewed. No pertinent past surgical  history.   Current Outpatient Rx  Name  Route  Sig  Dispense  Refill  . acetaminophen (TYLENOL) 325 MG tablet   Oral   Take 2 tablets (650 mg total) by mouth every 6 (six) hours as needed.   60 tablet   0   . amLODipine (NORVASC) 10 MG tablet   Oral   Take 1 tablet (10 mg total) by mouth daily.   30 tablet   6   . aspirin EC 81 MG tablet   Oral   Take 81 mg by mouth daily.         Marland Kitchen atorvastatin (LIPITOR) 20 MG tablet   Oral   Take 20 mg by mouth at bedtime.          . docusate sodium (COLACE) 100 MG capsule   Oral   Take 100 mg by mouth daily.         . feeding supplement, ENSURE COMPLETE, (ENSURE COMPLETE) LIQD   Oral   Take 237 mLs by mouth 2 (two) times daily between meals.   6 Bottle   4   . hydrALAZINE (APRESOLINE) 10 MG tablet   Oral   Take 1 tablet (10 mg total) by mouth 3 (three) times daily. Patient taking differently: Take 10 mg by mouth daily.    90 tablet   6   .  hydrochlorothiazide (HYDRODIURIL) 25 MG tablet   Oral   Take 12.5 mg by mouth daily.         . Vitamin D, Ergocalciferol, (DRISDOL) 50000 UNITS CAPS capsule   Oral   Take 50,000 Units by mouth once a week. Pt takes on Monday.            Allergies Zetia and Aspirin   Family History  Problem Relation Age of Onset  . Family history unknown: Yes    Social History Social History  Substance Use Topics  . Smoking status: Never Smoker   . Smokeless tobacco: None  . Alcohol Use: No    Review of Systems  Constitutional:   No fever or chills. No weight changes Eyes:   No blurry vision or double vision.  ENT:   No sore throat. Cardiovascular:   No chest pain. Respiratory:   No dyspnea or cough. Gastrointestinal:   Negative for abdominal pain, vomiting and diarrhea.  No BRBPR or melena. Genitourinary:   Negative for dysuria, urinary retention, bloody urine, or difficulty urinating. Musculoskeletal:   Negative for back pain. No joint swelling or pain. Skin:   Negative  for rash. Neurological:   Positive for headaches, without focal weakness or numbness. Psychiatric:  No anxiety or depression.   Endocrine:  No hot/cold intolerance, changes in energy, or sleep difficulty.  10-point ROS otherwise negative.  ____________________________________________   PHYSICAL EXAM:  VITAL SIGNS: ED Triage Vitals  Enc Vitals Group     BP 10/09/15 1322 134/74 mmHg     Pulse Rate 10/09/15 1322 55     Resp 10/09/15 1322 20     Temp 10/09/15 1322 97.3 F (36.3 C)     Temp Source 10/09/15 1322 Oral     SpO2 10/09/15 1322 100 %     Weight 10/09/15 1322 86 lb 9 oz (39.264 kg)     Height 10/09/15 1322 5\' 4"  (1.626 m)     Head Cir --      Peak Flow --      Pain Score --      Pain Loc --      Pain Edu? --      Excl. in GC? --      Constitutional:   Alert and oriented 2. Well appearing and in no distress. Eyes:   No scleral icterus. No conjunctival pallor. PERRL. EOMI ENT   Head:   Normocephalic with small scalp contusion on the right parietal scalp, hemostatic. No laceration.   Nose:   No congestion/rhinnorhea. No septal hematoma   Mouth/Throat:   MMM, no pharyngeal erythema. No peritonsillar mass. No uvula shift.   Neck:   No stridor. No SubQ emphysema. No meningismus. No midline spinal tenderness Hematological/Lymphatic/Immunilogical:   No cervical lymphadenopathy. Cardiovascular:   RRR. Normal and symmetric distal pulses are present in all extremities. No murmurs, rubs, or gallops. Respiratory:   Normal respiratory effort without tachypnea nor retractions. Breath sounds are clear and equal bilaterally. No wheezes/rales/rhonchi. Gastrointestinal:   Soft and nontender. No distention. There is no CVA tenderness.  No rebound, rigidity, or guarding. Genitourinary:   deferred Musculoskeletal:   Mild left hip tenderness, intact range of motion. Right lower extremity unremarkable. Pelvis stable. Upper extremity is in chest wall stable. No  edema Neurologic:   Normal speech and language.  CN 2-10 normal. Motor grossly intact. No pronator drift.  Normal gait. No gross focal neurologic deficits are appreciated.  Skin:    Skin is warm, dry and  intact except for contusion on the right scalp. No rash noted.  No petechiae, purpura, or bullae. Psychiatric:   Anxious..  ____________________________________________    LABS (pertinent positives/negatives) (all labs ordered are listed, but only abnormal results are displayed) Labs Reviewed  CBC WITH DIFFERENTIAL/PLATELET - Abnormal; Notable for the following:    Hemoglobin 11.7 (*)    All other components within normal limits  URINALYSIS COMPLETEWITH MICROSCOPIC ([facility] ONLY) - Abnormal; Notable for the following:    Color, Urine YELLOW (*)    APPearance CLEAR (*)    Squamous Epithelial / LPF 0-5 (*)    All other components within normal limits  COMPREHENSIVE METABOLIC PANEL  LIPASE, BLOOD   ____________________________________________   EKG    ____________________________________________    RADIOLOGY  CT head and C-spine unremarkable X-ray pelvis and left hip unremarkable X-ray chest unremarkable  ____________________________________________   PROCEDURES   ____________________________________________   INITIAL IMPRESSION / ASSESSMENT AND PLAN / ED COURSE  Pertinent labs & imaging results that were available during my care of the patient were reviewed by me and considered in my medical decision making (see chart for details).  Patient resents with a fall. No preceding or significant following symptoms. By report from nursing home and is mechanical in nature. No serious injuries. We'll follow up labs and urinalysis to screen for any underlying pathology that may have precipitated the fall. Care of the patient is signed out to oncoming physician Dr. Langston Masker awaiting lab results. If there are no significant abnormalities, patient is stable for discharge home  and follow-up with her primary care doctor.     ____________________________________________   FINAL CLINICAL IMPRESSION(S) / ED DIAGNOSES  Final diagnoses:  Scalp contusion, initial encounter      Sharman Cheek, MD 10/09/15 1520

## 2015-10-09 NOTE — Progress Notes (Signed)
ED visit made. Patient is followed at [facility] with a hospice diagnosis of Aortic Stenosis, she is a DNR code. Out of Facility DNR in place and visualized in patient's ED room. Mrs. Charlotte Schultz was sent to the ED via EMS for evaluation after a fall in which she hit her head. Per report from hospice nurse this fall resulted in bleeding from a laceration to her head.  Writer spoke with attending physician Dr. Scotty Court who reports patient has been ordered a head CT and xrays of her hips. Patient was not in the room at time of visit, her sister in law was present, she voiced understanding of the plan for CT, Xrays and labs. Writer to update hospice team. Thank you. Dayna Barker RN, BSN, [facility] [phone] c

## 2015-10-09 NOTE — ED Notes (Signed)
Pt arrives via [facility] from [facility] after fall while going to bathroom. Laceration to top right side of head. Pt states her head hurts. Denies any other injuries. Unknown LOC.

## 2015-10-09 NOTE — ED Notes (Signed)
Patient transported to CT 

## 2015-10-09 NOTE — Discharge Instructions (Signed)

## 2015-10-09 NOTE — ED Notes (Signed)
MD at bedside. Pt states that bilateral hips hurt when legs moved by MD.

## 2016-07-02 ENCOUNTER — Emergency Department: Payer: Medicare Other

## 2016-07-02 ENCOUNTER — Emergency Department
Admission: EM | Admit: 2016-07-02 | Discharge: 2016-07-03 | Disposition: A | Discharge status: Home / Self Care | Payer: Medicare Other | Attending: Emergency Medicine | Admitting: Emergency Medicine

## 2016-07-02 DIAGNOSIS — R55 Syncope and collapse: Secondary | ICD-10-CM

## 2016-07-02 DIAGNOSIS — I110 Hypertensive heart disease with heart failure: Secondary | ICD-10-CM | POA: Diagnosis not present

## 2016-07-02 DIAGNOSIS — N83201 Unspecified ovarian cyst, right side: Secondary | ICD-10-CM

## 2016-07-02 DIAGNOSIS — I2510 Atherosclerotic heart disease of native coronary artery without angina pectoris: Secondary | ICD-10-CM | POA: Diagnosis not present

## 2016-07-02 DIAGNOSIS — I509 Heart failure, unspecified: Secondary | ICD-10-CM | POA: Insufficient documentation

## 2016-07-02 DIAGNOSIS — Z7982 Long term (current) use of aspirin: Secondary | ICD-10-CM | POA: Diagnosis not present

## 2016-07-02 DIAGNOSIS — K5730 Diverticulosis of large intestine without perforation or abscess without bleeding: Secondary | ICD-10-CM | POA: Diagnosis not present

## 2016-07-02 DIAGNOSIS — Z79899 Other long term (current) drug therapy: Secondary | ICD-10-CM | POA: Insufficient documentation

## 2016-07-02 DIAGNOSIS — R609 Edema, unspecified: Secondary | ICD-10-CM

## 2016-07-02 NOTE — ED Triage Notes (Addendum)
Pt presents to ED via [facility] from [facility]. Pt has hx of dementia, scoliosis, urinary incontinence, unsteady gait. Pt was assisted to ground after nearly falling. EMS reports pt was sweaty after helped to ground. Per EMS pt was having facial pain, pt denies pain to this RN. VS with EMS 143/56, CBG 112, HR 78. Pt is alert upon arrival, knows name but won't say birthday.

## 2016-07-02 NOTE — ED Notes (Signed)
Pt placed on bed alarm. Alarm hooked to bed and hooked to pt L shoulder.

## 2016-07-02 NOTE — ED Notes (Signed)
Dr. Brown at bedside

## 2016-07-03 ENCOUNTER — Emergency Department: Payer: Medicare Other

## 2016-07-03 ENCOUNTER — Encounter: Payer: Self-pay | Admitting: Radiology

## 2016-07-03 DIAGNOSIS — K5730 Diverticulosis of large intestine without perforation or abscess without bleeding: Secondary | ICD-10-CM | POA: Diagnosis not present

## 2016-07-03 LAB — CBC
HEMATOCRIT: 34.7 % — AB (ref 35.0–47.0)
Hemoglobin: 11.9 g/dL — ABNORMAL LOW (ref 12.0–16.0)
MCH: 29.4 pg (ref 26.0–34.0)
MCHC: 34.3 g/dL (ref 32.0–36.0)
MCV: 85.6 fL (ref 80.0–100.0)
PLATELETS: 178 10*3/uL (ref 150–440)
RBC: 4.06 MIL/uL (ref 3.80–5.20)
RDW: 13.7 % (ref 11.5–14.5)
WBC: 5.1 10*3/uL (ref 3.6–11.0)

## 2016-07-03 LAB — BASIC METABOLIC PANEL
Anion gap: 7 (ref 5–15)
BUN: 14 mg/dL (ref 6–20)
CHLORIDE: 107 mmol/L (ref 101–111)
CO2: 28 mmol/L (ref 22–32)
CREATININE: 0.91 mg/dL (ref 0.44–1.00)
Calcium: 10.8 mg/dL — ABNORMAL HIGH (ref 8.9–10.3)
GFR calc Af Amer: >60 mL/min (ref >60)
GFR calc non Af Amer: 55 mL/min — ABNORMAL LOW (ref >60)
Glucose, Bld: 107 mg/dL — ABNORMAL HIGH (ref 65–99)
POTASSIUM: 3.4 mmol/L — AB (ref 3.5–5.1)
Sodium: 142 mmol/L (ref 135–145)

## 2016-07-03 LAB — URINALYSIS COMPLETE WITH MICROSCOPIC (ARMC ONLY)
BILIRUBIN URINE: NEGATIVE
GLUCOSE, UA: NEGATIVE mg/dL
HGB URINE DIPSTICK: NEGATIVE
Ketones, ur: NEGATIVE mg/dL
LEUKOCYTES UA: NEGATIVE
NITRITE: NEGATIVE
PH: 8 (ref 5.0–8.0)
Protein, ur: NEGATIVE mg/dL
Specific Gravity, Urine: 1.014 (ref 1.005–1.030)

## 2016-07-03 LAB — TROPONIN I: Troponin I: <0.03 ng/mL (ref <0.03)

## 2016-07-03 MED ORDER — OXYCODONE-ACETAMINOPHEN 5-325 MG PO TABS
1.0000 | ORAL_TABLET | Freq: Once | ORAL | Status: AC
  Administered 2016-07-03: 1 via ORAL

## 2016-07-03 MED ORDER — DIATRIZOATE MEGLUMINE & SODIUM 66-10 % PO SOLN
15.0000 mL | Freq: Once | ORAL | Status: AC
  Administered 2016-07-03: 15 mL via ORAL

## 2016-07-03 MED ORDER — OXYCODONE-ACETAMINOPHEN 5-325 MG PO TABS
ORAL_TABLET | ORAL | Status: AC
  Administered 2016-07-03: 1 via ORAL
  Filled 2016-07-03: qty 1

## 2016-07-03 MED ORDER — IOPAMIDOL (ISOVUE-300) INJECTION 61%
100.0000 mL | Freq: Once | INTRAVENOUS | Status: AC | PRN
  Administered 2016-07-03: 100 mL via INTRAVENOUS

## 2016-07-03 MED ORDER — DIATRIZOATE MEGLUMINE & SODIUM 66-10 % PO SOLN: 15.0000 mL | Freq: Once | ORAL | Status: DC

## 2016-07-03 NOTE — ED Notes (Signed)
Family took pt home instead of pt going back to [facility].

## 2016-07-03 NOTE — ED Notes (Addendum)
Blue paper scrub pants placed on patient for D/C.

## 2016-07-03 NOTE — ED Notes (Signed)
Pt returned from U/S via stretcher. 

## 2016-07-03 NOTE — ED Notes (Signed)
Pt returned from CT of abdomen via stretcher.

## 2016-07-03 NOTE — ED Notes (Signed)
Family called out stating pt was finished drinking oral contrast. CT notified.

## 2016-07-03 NOTE — ED Notes (Signed)
Pt and bed were wet upon this RN and Jann,EDT entering room to get a urine specimen. Pt was cleaned, dried, bed linen changes, new blankets provided. Pt was moved up in bed after new brief applied. Wet pajama bottoms placed in pt belongings bag.

## 2016-07-03 NOTE — ED Provider Notes (Signed)
[facility] Emergency Department Provider Note  ____________________________________________   First MD Initiated Contact with Patient 07/02/16 2336     (approximate)  I have reviewed the triage vital signs and the nursing notes.   HISTORY  Chief Complaint Fall    HPI Charlotte Schultz is a 82 y.o. female with history of dementia scoliosis urinary incontinence presents with 2 near syncopal episode at [facility] nursing home today. Per report from EMS the patient did not fall but was rather helped to the ground by staff there at [facility]. Patient is pleasantly demented however she does point to her chest as area of discomfort.   Past Medical History:  Diagnosis Date  . CAD (coronary artery disease)    a. unclear hx.  . Dementia   . Failure to thrive in adult   . HLD (hyperlipidemia)   . Hypertension   . Hypertensive cardiomyopathy (HCC)    a. 09/2014 Echo: EF 50-55%, sev LVH, mod AI, mild MR.  Marland Kitchen Hypertensive hypertrophic cardiomyopathy, with heart failure (HCC)    a. echo 10/2014: EF 50-55%, elevated LV & LA end diastlic pressures, impaired relaxation, severe LVH, severely dilated LA, moderately dilated RA, small global pericardial effusion, mild-mod MR, mild-mod AI, Ao scl w/o stenosis, mild TR, hypertensive cardiomyopathy  . Infectious colitis   . LVH (left ventricular hypertrophy) due to hypertensive disease   . Moderate aortic insufficiency    a. 09/2014 Echo: mod AI.  Marland Kitchen Ovarian cyst   . Syncope    a. 09/2014 - normal EEG, nl LV fxn by echo, neg Heat CT.    Patient Active Problem List   Diagnosis Date Noted  . Hypertensive hypertrophic cardiomyopathy, with heart failure (HCC)   . Failure to thrive in adult   . Hypertension   . Infectious colitis   . HLD (hyperlipidemia)   . Hypertensive cardiomyopathy (HCC)   . Ovarian cyst   . CAD (coronary artery disease)   . Dementia   . Moderate aortic insufficiency   . Syncope   . LVH  (left ventricular hypertrophy) due to hypertensive disease     History reviewed. No pertinent surgical history.  Prior to Admission medications   Medication Sig Start Date End Date Taking? Authorizing Provider  acetaminophen (TYLENOL) 325 MG tablet Take 2 tablets (650 mg total) by mouth every 6 (six) hours as needed. 10/09/15   Sharman Cheek, MD  amLODipine (NORVASC) 10 MG tablet Take 1 tablet (10 mg total) by mouth daily. 09/20/14   Sondra Barges, PA-C  aspirin EC 81 MG tablet Take 81 mg by mouth daily.    Historical Provider, MD  atorvastatin (LIPITOR) 20 MG tablet Take 20 mg by mouth at bedtime.     Historical Provider, MD  docusate sodium (COLACE) 100 MG capsule Take 100 mg by mouth daily.    Historical Provider, MD  feeding supplement, ENSURE COMPLETE, (ENSURE COMPLETE) LIQD Take 237 mLs by mouth 2 (two) times daily between meals. 09/10/14   Pincus Large, DO  hydrALAZINE (APRESOLINE) 10 MG tablet Take 1 tablet (10 mg total) by mouth 3 (three) times daily. Patient taking differently: Take 10 mg by mouth daily.  10/24/14   Raymon Mutton Dunn, PA-C  hydrochlorothiazide (HYDRODIURIL) 25 MG tablet Take 12.5 mg by mouth daily.    Historical Provider, MD  Vitamin D, Ergocalciferol, (DRISDOL) 50000 UNITS CAPS capsule Take 50,000 Units by mouth once a week. Pt takes on Monday.    Historical Provider, MD  Allergies Zetia [ezetimibe] and Aspirin  Family History  Problem Relation Age of Onset  . Family history unknown: Yes    Social History Social History  Substance Use Topics  . Smoking status: Never Smoker  . Smokeless tobacco: Not on file  . Alcohol use No    Review of Systems Constitutional: No fever/chills Eyes: No visual changes. ENT: No sore throat. Cardiovascular: Denies chest pain. Respiratory: Denies shortness of breath. Gastrointestinal: No abdominal pain.  No nausea, no vomiting.  No diarrhea.  No constipation. Genitourinary: Negative for dysuria. Musculoskeletal:  Negative for back pain. Skin: Negative for rash. Neurological: Negative for headaches, focal weakness or numbness.  10-point ROS otherwise negative.  ____________________________________________   PHYSICAL EXAM:  VITAL SIGNS: ED Triage Vitals [07/02/16 2225]  Enc Vitals Group     BP (!) 158/46     Pulse Rate 72     Resp 18     Temp 98.2 F (36.8 C)     Temp Source Oral     SpO2 100 %     Weight 130 lb 14.4 oz (59.4 kg)     Height      Head Circumference      Peak Flow      Pain Score      Pain Loc      Pain Edu?      Excl. in GC?     Constitutional: Alert and oriented. Well appearing and in no acute distress. Eyes: Conjunctivae are normal. PERRL. EOMI. Head: Atraumatic. Ears:  Healthy appearing ear canals and TMs bilaterally Nose: No congestion/rhinnorhea. Mouth/Throat: Mucous membranes are moist.  Oropharynx non-erythematous. Neck: No stridor.  No meningeal signs.   Cardiovascular: Normal rate, regular rhythm. Good peripheral circulation. Grossly normal heart sounds.   Respiratory: Normal respiratory effort.  No retractions. Lungs CTAB. Gastrointestinal: Soft and nontender. No distention.  Musculoskeletal: No lower extremity tenderness nor edema. No gross deformities of extremities. Neurologic:  Normal speech and language. No gross focal neurologic deficits are appreciated.  Skin:  Skin is warm, dry and intact. No rash noted. Psychiatric: Mood and affect are normal. Speech and behavior are normal.  ____________________________________________   LABS (all labs ordered are listed, but only abnormal results are displayed)  Labs Reviewed  CBC - Abnormal; Notable for the following:       Result Value   Hemoglobin 11.9 (*)    HCT 34.7 (*)    All other components within normal limits  BASIC METABOLIC PANEL  TROPONIN I     RADIOLOGY I, Sherman N BROWN, personally viewed and evaluated these images (plain radiographs) as part of my medical decision making, as  well as reviewing the written report by the radiologist.  Ct Head Wo Contrast  Result Date: 07/02/2016 CLINICAL DATA:  Syncope. EXAM: CT HEAD WITHOUT CONTRAST TECHNIQUE: Contiguous axial images were obtained from the base of the skull through the vertex without intravenous contrast. COMPARISON:  10/09/2015 FINDINGS: Brain: Stable generalized atrophy and chronic small vessel ischemia.No intracranial hemorrhage, mass effect, or midline shift. No hydrocephalus. The basilar cisterns are patent. Unchanged remote lacunar infarct in the right basal ganglia. No evidence of territorial infarct. No intracranial fluid collection. Vascular: No hyperdense vessel or abnormal calcification. Atherosclerosis of skullbase vasculature. Skull: Negative for fracture or focal lesion. Calvarium is intact. Sinuses/Orbits: No acute findings. Included paranasal sinuses and mastoid air cells are well aerated. Other: None. IMPRESSION: Stable atrophy and chronic small vessel ischemia. No acute intracranial abnormality. Electronically Signed   By: Shawna Orleans  Ehinger M.D.   On: 07/02/2016 23:47   Dg Chest Port 1 View  Result Date: 07/02/2016 CLINICAL DATA:  82 year old female with confusion and near syncope. Initial encounter. EXAM: PORTABLE CHEST 1 VIEW COMPARISON:  10/09/2015 and earlier. FINDINGS: Seated AP view of the chest at 2342 hours. Stable cardiomegaly and mediastinal contours. Calcified aortic atherosclerosis. No pneumothorax or pulmonary edema. Stable mild elevation of the left hemidiaphragm. No definite pleural effusion. No acute pulmonary opacity. Thoracolumbar scoliosis. IMPRESSION: Stable cardiomegaly. No acute cardiopulmonary abnormality. Electronically Signed   By: Odessa Fleming M.D.   On: 07/02/2016 23:54    ____________________________________________   PROCEDURES  Procedure(s) performed:   Procedures   ____________________________________________   INITIAL IMPRESSION / ASSESSMENT AND PLAN / ED  COURSE  Pertinent labs & imaging results that were available during my care of the patient were reviewed by me and considered in my medical decision making (see chart for details).   Clear etiology identified for the patient's near syncopal episode CT scan of the head unremarkable laboratory data likewise. I informed the patient's family of all clinical findings including that of diverticulosis ovarian cystic mass to which they were aware.  Clinical Course    ____________________________________________  FINAL CLINICAL IMPRESSION(S) / ED DIAGNOSES  Final diagnoses:  Near syncope  Diverticulosis of large intestine without hemorrhage  Right ovarian cyst  Cyst of right ovary     MEDICATIONS GIVEN DURING THIS VISIT:  Medications  diatrizoate meglumine-sodium (GASTROGRAFIN) 66-10 % solution 15 mL (not administered)     NEW OUTPATIENT MEDICATIONS STARTED DURING THIS VISIT:  New Prescriptions   No medications on file      Note:  This document was prepared using Dragon voice recognition software and may include unintentional dictation errors.    Darci Current, MD 07/03/16 [phone]

## 2016-07-28 ENCOUNTER — Emergency Department: Payer: Medicare Other

## 2016-07-28 ENCOUNTER — Emergency Department
Admission: EM | Admit: 2016-07-28 | Discharge: 2016-07-28 | Disposition: A | Discharge status: Home / Self Care | Payer: Medicare Other | Attending: Emergency Medicine | Admitting: Emergency Medicine

## 2016-07-28 DIAGNOSIS — I110 Hypertensive heart disease with heart failure: Secondary | ICD-10-CM | POA: Diagnosis not present

## 2016-07-28 DIAGNOSIS — I509 Heart failure, unspecified: Secondary | ICD-10-CM | POA: Diagnosis not present

## 2016-07-28 DIAGNOSIS — Z7982 Long term (current) use of aspirin: Secondary | ICD-10-CM | POA: Diagnosis not present

## 2016-07-28 DIAGNOSIS — R112 Nausea with vomiting, unspecified: Secondary | ICD-10-CM | POA: Diagnosis present

## 2016-07-28 DIAGNOSIS — I2510 Atherosclerotic heart disease of native coronary artery without angina pectoris: Secondary | ICD-10-CM | POA: Insufficient documentation

## 2016-07-28 DIAGNOSIS — K5730 Diverticulosis of large intestine without perforation or abscess without bleeding: Secondary | ICD-10-CM | POA: Diagnosis not present

## 2016-07-28 DIAGNOSIS — Z79899 Other long term (current) drug therapy: Secondary | ICD-10-CM | POA: Insufficient documentation

## 2016-07-28 LAB — URINALYSIS COMPLETE WITH MICROSCOPIC (ARMC ONLY)
Bilirubin Urine: NEGATIVE
GLUCOSE, UA: NEGATIVE mg/dL
HGB URINE DIPSTICK: NEGATIVE
Ketones, ur: NEGATIVE mg/dL
Leukocytes, UA: NEGATIVE
NITRITE: NEGATIVE
Protein, ur: NEGATIVE mg/dL
Specific Gravity, Urine: 1.012 (ref 1.005–1.030)
pH: 7 (ref 5.0–8.0)

## 2016-07-28 LAB — CBC WITH DIFFERENTIAL/PLATELET
BASOS ABS: 0 10*3/uL (ref 0–0.1)
BASOS PCT: 0 %
EOS ABS: 0 10*3/uL (ref 0–0.7)
EOS PCT: 0 %
HCT: 40.6 % (ref 35.0–47.0)
HEMOGLOBIN: 13.4 g/dL (ref 12.0–16.0)
LYMPHS PCT: 14 %
Lymphs Abs: 0.8 10*3/uL — ABNORMAL LOW (ref 1.0–3.6)
MCH: 28.9 pg (ref 26.0–34.0)
MCHC: 33.1 g/dL (ref 32.0–36.0)
MCV: 87.2 fL (ref 80.0–100.0)
MONO ABS: 0.2 10*3/uL (ref 0.2–0.9)
Monocytes Relative: 3 %
Neutro Abs: 4.9 10*3/uL (ref 1.4–6.5)
Neutrophils Relative %: 83 %
Platelets: 235 10*3/uL (ref 150–440)
RBC: 4.65 MIL/uL (ref 3.80–5.20)
RDW: 13.3 % (ref 11.5–14.5)
WBC: 6 10*3/uL (ref 3.6–11.0)

## 2016-07-28 LAB — COMPREHENSIVE METABOLIC PANEL
ALBUMIN: 3.9 g/dL (ref 3.5–5.0)
ALK PHOS: 78 U/L (ref 38–126)
ALT: 12 U/L — AB (ref 14–54)
ANION GAP: 8 (ref 5–15)
AST: 26 U/L (ref 15–41)
BUN: 19 mg/dL (ref 6–20)
CALCIUM: 11.1 mg/dL — AB (ref 8.9–10.3)
CHLORIDE: 104 mmol/L (ref 101–111)
CO2: 30 mmol/L (ref 22–32)
CREATININE: 0.98 mg/dL (ref 0.44–1.00)
GFR calc Af Amer: 58 mL/min — ABNORMAL LOW (ref >60)
GFR calc non Af Amer: 50 mL/min — ABNORMAL LOW (ref >60)
GLUCOSE: 138 mg/dL — AB (ref 65–99)
Potassium: 3.7 mmol/L (ref 3.5–5.1)
SODIUM: 142 mmol/L (ref 135–145)
Total Bilirubin: 0.5 mg/dL (ref 0.3–1.2)
Total Protein: 8 g/dL (ref 6.5–8.1)

## 2016-07-28 MED ORDER — SODIUM CHLORIDE 0.9 % IV BOLUS (SEPSIS)
500.0000 mL | Freq: Once | INTRAVENOUS | Status: AC
  Administered 2016-07-28: 500 mL via INTRAVENOUS

## 2016-07-28 MED ORDER — DIATRIZOATE MEGLUMINE & SODIUM 66-10 % PO SOLN
15.0000 mL | ORAL | Status: AC
  Administered 2016-07-28: 15 mL via ORAL

## 2016-07-28 MED ORDER — AMLODIPINE BESYLATE 5 MG PO TABS
10.0000 mg | ORAL_TABLET | Freq: Once | ORAL | Status: AC
  Administered 2016-07-28: 10 mg via ORAL
  Filled 2016-07-28: qty 2

## 2016-07-28 MED ORDER — IOPAMIDOL (ISOVUE-300) INJECTION 61%
75.0000 mL | Freq: Once | INTRAVENOUS | Status: AC | PRN
  Administered 2016-07-28: 75 mL via INTRAVENOUS

## 2016-07-28 MED ORDER — HYDRALAZINE HCL 10 MG PO TABS
10.0000 mg | ORAL_TABLET | Freq: Once | ORAL | Status: DC
  Filled 2016-07-28: qty 1

## 2016-07-28 MED ORDER — HYDRALAZINE HCL 10 MG PO TABS
10.0000 mg | ORAL_TABLET | Freq: Once | ORAL | Status: AC
  Administered 2016-07-28: 10 mg via ORAL

## 2016-07-28 NOTE — ED Provider Notes (Signed)
[facility] Emergency Department Provider Note    First MD Initiated Contact with Patient 07/28/16 1815     (approximate)  I have reviewed the triage vital signs and the nursing notes.   HISTORY  Chief Complaint Nausea and Emesis    HPI Tyler Deislizabeth Cowman is a 82 y.o. female who presents with brief episode of nausea and vomiting today at an assisted living facility. Upon arrival to the ER she denies any symptoms. The emesis was nonbloody and nonbilious. Patient denies any chest pain or shortness of breath. Patient was given Zofran at the nursing facility with improvement in her symptoms. She denies any numbness or tingling or headaches. Denies any recent fevers. Denies any diarrhea or constipation.   Past Medical History:  Diagnosis Date  . CAD (coronary artery disease)    a. unclear hx.  . Dementia   . Failure to thrive in adult   . HLD (hyperlipidemia)   . Hypertension   . Hypertensive cardiomyopathy (HCC)    a. 09/2014 Echo: EF 50-55%, sev LVH, mod AI, mild MR.  Marland Kitchen. Hypertensive hypertrophic cardiomyopathy, with heart failure (HCC)    a. echo 10/2014: EF 50-55%, elevated LV & LA end diastlic pressures, impaired relaxation, severe LVH, severely dilated LA, moderately dilated RA, small global pericardial effusion, mild-mod MR, mild-mod AI, Ao scl w/o stenosis, mild TR, hypertensive cardiomyopathy  . Infectious colitis   . LVH (left ventricular hypertrophy) due to hypertensive disease   . Moderate aortic insufficiency    a. 09/2014 Echo: mod AI.  Marland Kitchen. Ovarian cyst   . Syncope    a. 09/2014 - normal EEG, nl LV fxn by echo, neg Heat CT.    Patient Active Problem List   Diagnosis Date Noted  . Hypertensive hypertrophic cardiomyopathy, with heart failure (HCC)   . Failure to thrive in adult   . Hypertension   . Infectious colitis   . HLD (hyperlipidemia)   . Hypertensive cardiomyopathy (HCC)   . Ovarian cyst   . CAD (coronary artery disease)   .  Dementia   . Moderate aortic insufficiency   . Syncope   . LVH (left ventricular hypertrophy) due to hypertensive disease     History reviewed. No pertinent surgical history.  Prior to Admission medications   Medication Sig Start Date End Date Taking? Authorizing Provider  acetaminophen (TYLENOL) 325 MG tablet Take 2 tablets (650 mg total) by mouth every 6 (six) hours as needed. 10/09/15   Sharman CheekPhillip Stafford, MD  amLODipine (NORVASC) 10 MG tablet Take 1 tablet (10 mg total) by mouth daily. 09/20/14   Sondra Bargesyan M Dunn, PA-C  aspirin EC 81 MG tablet Take 81 mg by mouth daily.    Historical Provider, MD  atorvastatin (LIPITOR) 20 MG tablet Take 20 mg by mouth at bedtime.     Historical Provider, MD  docusate sodium (COLACE) 100 MG capsule Take 100 mg by mouth daily.    Historical Provider, MD  feeding supplement, ENSURE COMPLETE, (ENSURE COMPLETE) LIQD Take 237 mLs by mouth 2 (two) times daily between meals. 09/10/14   Pincus LargeJazma Y Phelps, DO  hydrALAZINE (APRESOLINE) 10 MG tablet Take 1 tablet (10 mg total) by mouth 3 (three) times daily. Patient taking differently: Take 10 mg by mouth daily.  10/24/14   Raymon Muttonyan M Dunn, PA-C  hydrochlorothiazide (HYDRODIURIL) 25 MG tablet Take 12.5 mg by mouth daily.    Historical Provider, MD  Vitamin D, Ergocalciferol, (DRISDOL) 50000 UNITS CAPS capsule Take 50,000 Units by mouth  once a week. Pt takes on Monday.    Historical Provider, MD    Allergies Zetia [ezetimibe] and Aspirin  Family History  Problem Relation Age of Onset  . Family history unknown: Yes    Social History Social History  Substance Use Topics  . Smoking status: Never Smoker  . Smokeless tobacco: Never Used  . Alcohol use No    Review of Systems Patient denies headaches, rhinorrhea, blurry vision, numbness, shortness of breath, chest pain, edema, cough, abdominal pain, nausea, vomiting, diarrhea, dysuria, fevers, rashes or hallucinations unless otherwise stated above in  HPI. ____________________________________________   PHYSICAL EXAM:  VITAL SIGNS: Vitals:   07/28/16 2030 07/28/16 2100  BP: (!) 183/61 (!) 182/57  Pulse: 78 78  Resp: 17 15  Temp:      Constitutional: Alert and oriented. Well appearing and in no acute distress. Eyes: Conjunctivae are normal. PERRL. EOMI. Head: Atraumatic. Nose: No congestion/rhinnorhea. Mouth/Throat: Mucous membranes are moist.  Oropharynx non-erythematous. Neck: No stridor. Painless ROM. No cervical spine tenderness to palpation Hematological/Lymphatic/Immunilogical: No cervical lymphadenopathy. Cardiovascular: Normal rate, regular rhythm. Grossly normal heart sounds.  Good peripheral circulation. Respiratory: Normal respiratory effort.  No retractions. Lungs CTAB. Gastrointestinal: Soft and nontender. No distention. No abdominal bruits. No CVA tenderness. Genitourinary:  Musculoskeletal: No lower extremity tenderness nor edema.  No joint effusions. Neurologic:  Normal speech and language. No gross focal neurologic deficits are appreciated. No gait instability. Skin:  Skin is warm, dry and intact. No rash noted. Psychiatric: Mood and affect are normal. Speech and behavior are normal.  ____________________________________________   LABS (all labs ordered are listed, but only abnormal results are displayed)  Results for orders placed or performed during the hospital encounter of 07/28/16 (from the past 24 hour(s))  CBC with Differential/Platelet     Status: Abnormal   Collection Time: 07/28/16  7:03 PM  Result Value Ref Range   WBC 6.0 3.6 - 11.0 K/uL   RBC 4.65 3.80 - 5.20 MIL/uL   Hemoglobin 13.4 12.0 - 16.0 g/dL   HCT 16.1 09.6 - 04.5 %   MCV 87.2 80.0 - 100.0 fL   MCH 28.9 26.0 - 34.0 pg   MCHC 33.1 32.0 - 36.0 g/dL   RDW 40.9 81.1 - 91.4 %   Platelets 235 150 - 440 K/uL   Neutrophils Relative % 83 %   Neutro Abs 4.9 1.4 - 6.5 K/uL   Lymphocytes Relative 14 %   Lymphs Abs 0.8 (L) 1.0 - 3.6 K/uL    Monocytes Relative 3 %   Monocytes Absolute 0.2 0.2 - 0.9 K/uL   Eosinophils Relative 0 %   Eosinophils Absolute 0.0 0 - 0.7 K/uL   Basophils Relative 0 %   Basophils Absolute 0.0 0 - 0.1 K/uL  Comprehensive metabolic panel     Status: Abnormal   Collection Time: 07/28/16  7:03 PM  Result Value Ref Range   Sodium 142 135 - 145 mmol/L   Potassium 3.7 3.5 - 5.1 mmol/L   Chloride 104 101 - 111 mmol/L   CO2 30 22 - 32 mmol/L   Glucose, Bld 138 (H) 65 - 99 mg/dL   BUN 19 6 - 20 mg/dL   Creatinine, Ser 7.82 0.44 - 1.00 mg/dL   Calcium 95.6 (H) 8.9 - 10.3 mg/dL   Total Protein 8.0 6.5 - 8.1 g/dL   Albumin 3.9 3.5 - 5.0 g/dL   AST 26 15 - 41 U/L   ALT 12 (L) 14 - 54 U/L  Alkaline Phosphatase 78 38 - 126 U/L   Total Bilirubin 0.5 0.3 - 1.2 mg/dL   GFR calc non Af Amer 50 (L) >60 mL/min   GFR calc Af Amer 58 (L) >60 mL/min   Anion gap 8 5 - 15  Urinalysis complete, with microscopic ([facility] only)     Status: Abnormal   Collection Time: 07/28/16  7:48 PM  Result Value Ref Range   Color, Urine YELLOW (A) YELLOW   APPearance CLEAR (A) CLEAR   Glucose, UA NEGATIVE NEGATIVE mg/dL   Bilirubin Urine NEGATIVE NEGATIVE   Ketones, ur NEGATIVE NEGATIVE mg/dL   Specific Gravity, Urine 1.012 1.005 - 1.030   Hgb urine dipstick NEGATIVE NEGATIVE   pH 7.0 5.0 - 8.0   Protein, ur NEGATIVE NEGATIVE mg/dL   Nitrite NEGATIVE NEGATIVE   Leukocytes, UA NEGATIVE NEGATIVE   RBC / HPF 6-30 0 - 5 RBC/hpf   WBC, UA 0-5 0 - 5 WBC/hpf   Bacteria, UA RARE (A) NONE SEEN   Squamous Epithelial / LPF 0-5 (A) NONE SEEN   ____________________________________________  ____________________________________________  RADIOLOGY   ____________________________________________   PROCEDURES  Procedure(s) performed: none    Critical Care performed: no ____________________________________________   INITIAL IMPRESSION / ASSESSMENT AND PLAN / ED COURSE  Pertinent labs & imaging results that were available  during my care of the patient were reviewed by me and considered in my medical decision making (see chart for details).  DDX: Gastroenteritis, dehydration, pneumonia, volvulus, hepatitis, cholecystitis,  Agam Wrubel is a 82 y.o. who presents to the ED with a brief episode of nausea and vomiting. Patient arrives asymptomatic and hemodynamically stable. Her abdominal exam is soft and benign. Patient denies any shortness of breath or chest pain. She is a symptomatic at this time. Given her history will order a laboratory evaluation for any evidence of significant electrolyte abnormality due to nausea and vomiting. Patient does have a history of heart disease that she denies any chest pain or shortness of breath. Given her history of colitis and age will order CT imaging to evaluate for any acute obstructive process, mass or infectious process.  The patient will be placed on continuous pulse oximetry and telemetry for monitoring.  Laboratory evaluation will be sent to evaluate for the above complaints.     Clinical Course  Comment By Time  Patient reassessed at bedside. Patient denies any nausea at this time, exam soft. Patient without any evidence of leukocytosis or acidosis. Does have mildly elevated calcium which may be secondary to dehydration but also may be a component of her symptoms. We'll continue providing IV hydration. Awaiting CT imaging. Patient will be signed out to Dr. Don Perking pending CT imaging.  Have discussed with the patient and available family all diagnostics and treatments performed thus far and all questions were answered to the best of my ability. The patient demonstrates understanding and agreement with plan.  Willy Eddy, MD 08/27 2100     ____________________________________________   FINAL CLINICAL IMPRESSION(S) / ED DIAGNOSES  Final diagnoses:  Non-intractable vomiting with nausea, vomiting of unspecified type      NEW MEDICATIONS STARTED DURING THIS  VISIT:  New Prescriptions   No medications on file     Note:  This document was prepared using Dragon voice recognition software and may include unintentional dictation errors.    Willy Eddy, MD 07/28/16 2138

## 2016-07-28 NOTE — ED Notes (Signed)
CT called for pt finished contrast  

## 2016-07-28 NOTE — ED Notes (Signed)
Pharm called

## 2016-07-28 NOTE — ED Provider Notes (Signed)
-----------------------------------------   9:47 PM on 07/28/2016 -----------------------------------------   Blood pressure (!) 182/57, pulse 78, temperature 97.2 F (36.2 C), temperature source Oral, resp. rate 15, weight 117 lb (53.1 kg), SpO2 98 %.  Assuming care from Dr. Roxan Hockey of Charlotte Schultz is a 82 y.o. female with a chief complaint of Nausea and Emesis .    82 year old female with a history of dementia who presents after one episode of nausea and vomiting that self resolved at her skilled nursing facility. Patient asymptomatic here. Patient's abdominal exam is reassuring. I was asked by Dr. Jarold Motto to follow up on the results of the CT scan to rule out obstruction.\  UA negative for UTI. CBC and CMP WNL. EKG with no evidence of ischemia. CXR negative.  ED ECG REPORT I, Nita Sickle, the attending physician, personally viewed and interpreted this ECG.  Normal sinus rhythm, rate of 84, first-degree AV block, normal QRS and QTc intervals, normal axis, no ST elevations or depressions.   CT a/p: pending   _________________________ 10:06 PM on 07/28/2016 -----------------------------------------  CT: 1. Stable exam. No acute findings in the abdomen or pelvis. 2. 7.6 cm left upper pole renal cyst. 3. Stable right adnexal cyst. 4. Left colonic diverticulosis without diverticulitis.  Patient re-examined and remains asymptomatic with soft, non tender abdomen. Will be discharged at this time with instructions provided by Dr. Roxan Hockey.   Nita Sickle, MD 07/28/16 2207

## 2016-07-28 NOTE — ED Triage Notes (Signed)
Pt came to ED via EMS from the Dementia Unit of [facility]. Pt has hx of dementia. Sent over to ED for nausea, vomiting. Given 4 zofran by staff at nursing home before being picked up by EMS.

## 2016-07-28 NOTE — ED Notes (Addendum)
X-ray at bedside

## 2016-07-30 ENCOUNTER — Emergency Department
Admission: EM | Admit: 2016-07-30 | Discharge: 2016-07-30 | Disposition: A | Discharge status: Home / Self Care | Payer: Medicare Other | Attending: Emergency Medicine | Admitting: Emergency Medicine

## 2016-07-30 ENCOUNTER — Encounter: Payer: Self-pay | Admitting: Intensive Care

## 2016-07-30 DIAGNOSIS — Z043 Encounter for examination and observation following other accident: Secondary | ICD-10-CM | POA: Insufficient documentation

## 2016-07-30 DIAGNOSIS — Z7982 Long term (current) use of aspirin: Secondary | ICD-10-CM | POA: Diagnosis not present

## 2016-07-30 DIAGNOSIS — W19XXXA Unspecified fall, initial encounter: Secondary | ICD-10-CM | POA: Insufficient documentation

## 2016-07-30 DIAGNOSIS — I2510 Atherosclerotic heart disease of native coronary artery without angina pectoris: Secondary | ICD-10-CM | POA: Diagnosis not present

## 2016-07-30 DIAGNOSIS — I509 Heart failure, unspecified: Secondary | ICD-10-CM | POA: Insufficient documentation

## 2016-07-30 DIAGNOSIS — Y999 Unspecified external cause status: Secondary | ICD-10-CM | POA: Insufficient documentation

## 2016-07-30 DIAGNOSIS — I110 Hypertensive heart disease with heart failure: Secondary | ICD-10-CM | POA: Insufficient documentation

## 2016-07-30 DIAGNOSIS — Y9289 Other specified places as the place of occurrence of the external cause: Secondary | ICD-10-CM | POA: Diagnosis not present

## 2016-07-30 DIAGNOSIS — Y939 Activity, unspecified: Secondary | ICD-10-CM | POA: Insufficient documentation

## 2016-07-30 NOTE — ED Notes (Signed)
This RN called [employer] and gave report to G[place]anna, Charity fundraiser. EMS leaving hospital at this time.

## 2016-07-30 NOTE — ED Triage Notes (Signed)
Patient arrived by EMS from [facility]. Staff called EMS due to unwtinessed fall and finding patient on the floor. Patient has DNR form and has HX of dementia. According to staff patient is at her baseline. Pt is disoriented X4

## 2016-07-30 NOTE — ED Notes (Addendum)
EMS pt to 1 hallway, pt from [facility], unwitnessed fall approx 20 min PTA , no noted injuries , according to EMS " pt is at baseline" pt has hx of dementia,  BP 143/51, HR 82, Sats 94% RA

## 2016-07-30 NOTE — ED Provider Notes (Signed)
[facility] Emergency Department Provider Note   ____________________________________________   First MD Initiated Contact with Patient 07/30/16 1609     (approximate)  I have reviewed the triage vital signs and the nursing notes.   HISTORY  Chief Complaint Fall   HPI Charlotte Schultz is a 82 y.o. female patient was found on the floor by staff. She is at her baseline mental status. She is not nauseated or vomiting. She has no pain or discomfort on palpation anywhere on her body she denies hitting her head she has no bruising on her head and neck is not tender chest arms abdomen and pelvis legs back none of it is tender she was seen in the ER 2 days ago and had a CT of her pelvis at that time she had a head CT beginning of the month pathology has been found.   Past Medical History:  Diagnosis Date  . CAD (coronary artery disease)    a. unclear hx.  . Dementia   . Failure to thrive in adult   . HLD (hyperlipidemia)   . Hypertension   . Hypertensive cardiomyopathy (HCC)    a. 09/2014 Echo: EF 50-55%, sev LVH, mod AI, mild MR.  Marland Kitchen Hypertensive hypertrophic cardiomyopathy, with heart failure (HCC)    a. echo 10/2014: EF 50-55%, elevated LV & LA end diastlic pressures, impaired relaxation, severe LVH, severely dilated LA, moderately dilated RA, small global pericardial effusion, mild-mod MR, mild-mod AI, Ao scl w/o stenosis, mild TR, hypertensive cardiomyopathy  . Infectious colitis   . LVH (left ventricular hypertrophy) due to hypertensive disease   . Moderate aortic insufficiency    a. 09/2014 Echo: mod AI.  Marland Kitchen Ovarian cyst   . Syncope    a. 09/2014 - normal EEG, nl LV fxn by echo, neg Heat CT.    Patient Active Problem List   Diagnosis Date Noted  . Hypertensive hypertrophic cardiomyopathy, with heart failure (HCC)   . Failure to thrive in adult   . Hypertension   . Infectious colitis   . HLD (hyperlipidemia)   . Hypertensive cardiomyopathy (HCC)    . Ovarian cyst   . CAD (coronary artery disease)   . Dementia   . Moderate aortic insufficiency   . Syncope   . LVH (left ventricular hypertrophy) due to hypertensive disease     History reviewed. No pertinent surgical history.  Prior to Admission medications   Medication Sig Start Date End Date Taking? Authorizing Provider  acetaminophen (TYLENOL) 325 MG tablet Take 2 tablets (650 mg total) by mouth every 6 (six) hours as needed. 10/09/15   Sharman Cheek, MD  amLODipine (NORVASC) 10 MG tablet Take 1 tablet (10 mg total) by mouth daily. 09/20/14   Sondra Barges, PA-C  aspirin EC 81 MG tablet Take 81 mg by mouth daily.    Historical Provider, MD  atorvastatin (LIPITOR) 20 MG tablet Take 20 mg by mouth at bedtime.     Historical Provider, MD  docusate sodium (COLACE) 100 MG capsule Take 100 mg by mouth daily.    Historical Provider, MD  feeding supplement, ENSURE COMPLETE, (ENSURE COMPLETE) LIQD Take 237 mLs by mouth 2 (two) times daily between meals. 09/10/14   Pincus Large, DO  hydrALAZINE (APRESOLINE) 10 MG tablet Take 1 tablet (10 mg total) by mouth 3 (three) times daily. Patient taking differently: Take 10 mg by mouth daily.  10/24/14   Ryan M Dunn, PA-C  hydrochlorothiazide (HYDRODIURIL) 25 MG tablet Take 12.5  mg by mouth daily.    Historical Provider, MD  Vitamin D, Ergocalciferol, (DRISDOL) 50000 UNITS CAPS capsule Take 50,000 Units by mouth once a week. Pt takes on Monday.    Historical Provider, MD    Allergies Zetia [ezetimibe] and Aspirin  Family History  Problem Relation Age of Onset  . Family history unknown: Yes    Social History Social History  Substance Use Topics  . Smoking status: Never Smoker  . Smokeless tobacco: Never Used  . Alcohol use No    Review of Systems Constitutional: No fever/chills Eyes: No visual changes. ENT: No sore throat. Cardiovascular: Denies chest pain. Respiratory: Denies shortness of breath. Gastrointestinal: No abdominal  pain.  No nausea, no vomiting.  No diarrhea.  No constipation. Genitourinary: Negative for dysuria. Musculoskeletal: Negative for back pain. Skin: Negative for rash. Neurological: Negative for headaches, focal weakness or numbness.  10-point ROS otherwise negative.  ____________________________________________   PHYSICAL EXAM:  VITAL SIGNS: ED Triage Vitals  Enc Vitals Group     BP 07/30/16 1613 (!) 147/44     Pulse Rate 07/30/16 1613 74     Resp 07/30/16 1613 (!) 24     Temp 07/30/16 1613 97.5 F (36.4 C)     Temp Source 07/30/16 1613 Oral     SpO2 07/30/16 1613 99 %     Weight 07/30/16 1617 117 lb (53.1 kg)     Height 07/30/16 1617 5\' 7"  (1.702 m)     Head Circumference --      Peak Flow --      Pain Score --      Pain Loc --      Pain Edu? --      Excl. in GC? --     Constitutional: Alert and orientedTo person. Well appearing and in no acute distress. Eyes: Conjunctivae are normal. PERRL. EOMI. Head: Atraumatic. Nose: No congestion/rhinnorhea. Mouth/Throat: Mucous membranes are moist.  Oropharynx non-erythematous. Neck: No stridor.  No cervical spine tenderness to palpation. Cardiovascular: Normal rate, regular rhythm. Grossly normal heart sounds.  Good peripheral circulation. Respiratory: Normal respiratory effort.  No retractions. Lungs CTAB. Gastrointestinal: Soft and nontender. No distention. No abdominal bruits. No CVA tenderness. Musculoskeletal: No lower extremity tenderness nor edema.  No joint effusions. Neurologic:  Normal speech and language. No gross focal neurologic deficits are appreciated. Patient moving arms and legs equally Skin:  Skin is warm, dry and intact. No rash noted. Psychiatric: Mood and affect are normal. Speech and behavior are normal.  ____________________________________________   LABS (all labs ordered are listed, but only abnormal results are displayed)  Labs Reviewed - No data to  display ____________________________________________  EKG   ____________________________________________  RADIOLOGY  ____________________________________________   PROCEDURES  P  Procedures   ____________________________________________   INITIAL IMPRESSION / ASSESSMENT AND PLAN / ED COURSE  Pertinent labs & imaging results that were available during my care of the patient were reviewed by me and considered in my medical decision making (see chart for details).    Clinical Course     ____________________________________________   FINAL CLINICAL IMPRESSION(S) / ED DIAGNOSES  Final diagnoses:  Fall, initial encounter      NEW MEDICATIONS STARTED DURING THIS VISIT:  New Prescriptions   No medications on file     Note:  This document was prepared using Dragon voice recognition software and may include unintentional dictation errors.    Arnaldo NatalPaul F Pinkney Venard, MD 07/30/16 [id number]

## 2016-08-11 ENCOUNTER — Emergency Department: Payer: Medicare Other

## 2016-08-11 ENCOUNTER — Emergency Department
Admission: EM | Admit: 2016-08-11 | Discharge: 2016-08-11 | Disposition: A | Discharge status: Home / Self Care | Payer: Medicare Other | Attending: Emergency Medicine | Admitting: Emergency Medicine

## 2016-08-11 DIAGNOSIS — I110 Hypertensive heart disease with heart failure: Secondary | ICD-10-CM | POA: Diagnosis not present

## 2016-08-11 DIAGNOSIS — Y999 Unspecified external cause status: Secondary | ICD-10-CM | POA: Diagnosis not present

## 2016-08-11 DIAGNOSIS — Z7982 Long term (current) use of aspirin: Secondary | ICD-10-CM | POA: Diagnosis not present

## 2016-08-11 DIAGNOSIS — F0390 Unspecified dementia without behavioral disturbance: Secondary | ICD-10-CM | POA: Diagnosis not present

## 2016-08-11 DIAGNOSIS — Y939 Activity, unspecified: Secondary | ICD-10-CM | POA: Diagnosis not present

## 2016-08-11 DIAGNOSIS — Z79899 Other long term (current) drug therapy: Secondary | ICD-10-CM | POA: Insufficient documentation

## 2016-08-11 DIAGNOSIS — Y929 Unspecified place or not applicable: Secondary | ICD-10-CM | POA: Diagnosis not present

## 2016-08-11 DIAGNOSIS — W19XXXA Unspecified fall, initial encounter: Secondary | ICD-10-CM | POA: Diagnosis not present

## 2016-08-11 DIAGNOSIS — I2510 Atherosclerotic heart disease of native coronary artery without angina pectoris: Secondary | ICD-10-CM | POA: Diagnosis not present

## 2016-08-11 DIAGNOSIS — I509 Heart failure, unspecified: Secondary | ICD-10-CM | POA: Diagnosis not present

## 2016-08-11 LAB — BASIC METABOLIC PANEL
ANION GAP: 10 (ref 5–15)
BUN: 16 mg/dL (ref 6–20)
CALCIUM: 10.2 mg/dL (ref 8.9–10.3)
CO2: 24 mmol/L (ref 22–32)
CREATININE: 1.12 mg/dL — AB (ref 0.44–1.00)
Chloride: 106 mmol/L (ref 101–111)
GFR calc Af Amer: 50 mL/min — ABNORMAL LOW (ref >60)
GFR calc non Af Amer: 43 mL/min — ABNORMAL LOW (ref >60)
GLUCOSE: 101 mg/dL — AB (ref 65–99)
Potassium: 3.4 mmol/L — ABNORMAL LOW (ref 3.5–5.1)
Sodium: 140 mmol/L (ref 135–145)

## 2016-08-11 LAB — URINALYSIS COMPLETE WITH MICROSCOPIC (ARMC ONLY)
BACTERIA UA: NONE SEEN
Bilirubin Urine: NEGATIVE
Glucose, UA: NEGATIVE mg/dL
Hgb urine dipstick: NEGATIVE
Ketones, ur: NEGATIVE mg/dL
Leukocytes, UA: NEGATIVE
NITRITE: NEGATIVE
PROTEIN: NEGATIVE mg/dL
SPECIFIC GRAVITY, URINE: 1.006 (ref 1.005–1.030)
WBC UA: NONE SEEN WBC/hpf (ref 0–5)
pH: 7 (ref 5.0–8.0)

## 2016-08-11 LAB — CBC WITH DIFFERENTIAL/PLATELET
Basophils Absolute: 0 10*3/uL (ref 0–0.1)
Basophils Relative: 1 %
Eosinophils Absolute: 0.1 10*3/uL (ref 0–0.7)
Eosinophils Relative: 1 %
HEMATOCRIT: 35.7 % (ref 35.0–47.0)
Hemoglobin: 12.2 g/dL (ref 12.0–16.0)
LYMPHS PCT: 28 %
Lymphs Abs: 2 10*3/uL (ref 1.0–3.6)
MCH: 29.4 pg (ref 26.0–34.0)
MCHC: 34.2 g/dL (ref 32.0–36.0)
MCV: 86.1 fL (ref 80.0–100.0)
MONO ABS: 0.7 10*3/uL (ref 0.2–0.9)
MONOS PCT: 10 %
NEUTROS ABS: 4.2 10*3/uL (ref 1.4–6.5)
Neutrophils Relative %: 60 %
Platelets: 165 10*3/uL (ref 150–440)
RBC: 4.15 MIL/uL (ref 3.80–5.20)
RDW: 13.8 % (ref 11.5–14.5)
WBC: 6.9 10*3/uL (ref 3.6–11.0)

## 2016-08-11 LAB — TROPONIN I: Troponin I: <0.03 ng/mL (ref <0.03)

## 2016-08-11 NOTE — ED Triage Notes (Signed)
Pt presents to ED from [facility] c/o unwitnessed fall. Pt has hx of dementia and is unable to recall event. Per EMS, pt was on the floor for 20 minutes; no obvious injuries noted.

## 2016-08-11 NOTE — ED Notes (Signed)
Report received from [place], [place].

## 2016-08-11 NOTE — ED Notes (Signed)
NAD noted at this time. Pt resting in bed in the hallway at this time. Pt denies needs, states that she is waiting for EMS to come pick her up. Pt calm and cooperative, respirations even and unlabored at this time. Pt in a well visualized area at this time. Will continue to monitor for further patient needs.

## 2016-08-11 NOTE — ED Notes (Addendum)
"  Charlotte" Schultz , [place] , agreed to have spouse Arlethia Finton take pt home or have EMS take pt back to [facility] and make decisions on his behalf

## 2016-08-11 NOTE — Discharge Instructions (Signed)
Please seek medical attention for any high fevers, chest pain, shortness of breath, change in behavior, persistent vomiting, bloody stool or any other new or concerning symptoms.  

## 2016-08-13 NOTE — ED Provider Notes (Signed)
[facility] Emergency Department Provider Note   ____________________________________________   I have reviewed the triage vital signs and the nursing notes.   HISTORY  Chief Complaint Fall and Dementia   History limited by: Dementia   HPI Charlotte Schultz is a 82 y.o. female who presents to the emergency department from living facility today because of unwitnessed fall.The patient has a history of dementia cannot provide any history. Patient has a history of frequent falls and has been seen in this emergency department for falls in the past. Patient denies any pain.   Past Medical History:  Diagnosis Date  . CAD (coronary artery disease)    a. unclear hx.  . Dementia   . Failure to thrive in adult   . HLD (hyperlipidemia)   . Hypertension   . Hypertensive cardiomyopathy (HCC)    a. 09/2014 Echo: EF 50-55%, sev LVH, mod AI, mild MR.  Marland Kitchen. Hypertensive hypertrophic cardiomyopathy, with heart failure (HCC)    a. echo 10/2014: EF 50-55%, elevated LV & LA end diastlic pressures, impaired relaxation, severe LVH, severely dilated LA, moderately dilated RA, small global pericardial effusion, mild-mod MR, mild-mod AI, Ao scl w/o stenosis, mild TR, hypertensive cardiomyopathy  . Infectious colitis   . LVH (left ventricular hypertrophy) due to hypertensive disease   . Moderate aortic insufficiency    a. 09/2014 Echo: mod AI.  Marland Kitchen. Ovarian cyst   . Syncope    a. 09/2014 - normal EEG, nl LV fxn by echo, neg Heat CT.    Patient Active Problem List   Diagnosis Date Noted  . Hypertensive hypertrophic cardiomyopathy, with heart failure (HCC)   . Failure to thrive in adult   . Hypertension   . Infectious colitis   . HLD (hyperlipidemia)   . Hypertensive cardiomyopathy (HCC)   . Ovarian cyst   . CAD (coronary artery disease)   . Dementia   . Moderate aortic insufficiency   . Syncope   . LVH (left ventricular hypertrophy) due to hypertensive disease     No  past surgical history on file.  Prior to Admission medications   Medication Sig Start Date End Date Taking? Authorizing Provider  acetaminophen (TYLENOL) 325 MG tablet Take 2 tablets (650 mg total) by mouth every 6 (six) hours as needed. 10/09/15   Sharman CheekPhillip Stafford, MD  amLODipine (NORVASC) 10 MG tablet Take 1 tablet (10 mg total) by mouth daily. 09/20/14   Sondra Bargesyan M Dunn, PA-C  aspirin EC 81 MG tablet Take 81 mg by mouth daily.    Historical Provider, MD  atorvastatin (LIPITOR) 20 MG tablet Take 20 mg by mouth at bedtime.     Historical Provider, MD  docusate sodium (COLACE) 100 MG capsule Take 100 mg by mouth daily.    Historical Provider, MD  feeding supplement, ENSURE COMPLETE, (ENSURE COMPLETE) LIQD Take 237 mLs by mouth 2 (two) times daily between meals. 09/10/14   Pincus LargeJazma Y Phelps, DO  hydrALAZINE (APRESOLINE) 10 MG tablet Take 1 tablet (10 mg total) by mouth 3 (three) times daily. Patient taking differently: Take 10 mg by mouth daily.  10/24/14   Raymon Muttonyan M Dunn, PA-C  hydrochlorothiazide (HYDRODIURIL) 25 MG tablet Take 12.5 mg by mouth daily.    Historical Provider, MD  Vitamin D, Ergocalciferol, (DRISDOL) 50000 UNITS CAPS capsule Take 50,000 Units by mouth once a week. Pt takes on Monday.    Historical Provider, MD    Allergies Zetia [ezetimibe] and Aspirin  Family History  Problem Relation Age of  Onset  . Family history unknown: Yes    Social History Social History  Substance Use Topics  . Smoking status: Never Smoker  . Smokeless tobacco: Never Used  . Alcohol use No    Review of Systems  Constitutional: Negative for fever. Cardiovascular: Negative for chest pain. Respiratory: Negative for shortness of breath. Gastrointestinal: Negative for abdominal pain, vomiting and diarrhea. Musculoskeletal: Negative for back pain. Neurological: Negative for headaches, focal weakness or numbness.   10-point ROS otherwise  negative.  ____________________________________________   PHYSICAL EXAM:  VITAL SIGNS: ED Triage Vitals  Enc Vitals Group     BP 08/11/16 1156 (!) 155/52     Pulse Rate 08/11/16 1156 69     Resp 08/11/16 1156 20     Temp 08/11/16 1156 98.2 F (36.8 C)     Temp Source 08/11/16 1156 Oral     SpO2 08/11/16 1156 100 %     Weight 08/11/16 1157 117 lb (53.1 kg)     Height 08/11/16 1157 5\' 7"  (1.702 m)   Constitutional: Awake and alert. Pleasant. No acute distress. Eyes: Conjunctivae are normal. Normal extraocular movements. ENT   Head: Normocephalic and atraumatic.   Nose: No congestion/rhinnorhea.   Mouth/Throat: Mucous membranes are moist.   Neck: No stridor. Hematological/Lymphatic/Immunilogical: No cervical lymphadenopathy. Cardiovascular: Normal rate, regular rhythm.  No murmurs, rubs, or gallops. Respiratory: Normal respiratory effort without tachypnea nor retractions. Breath sounds are clear and equal bilaterally. No wheezes/rales/rhonchi. Gastrointestinal: Soft and nontender. No distention.  Genitourinary: Deferred Musculoskeletal: Normal range of motion in all extremities. No lower extremity edema. Neurologic:  Awake and alert. No acute distress. Moving all extremities. Skin:  Skin is warm, dry and intact. No rash noted. Psychiatric: Mood and affect are normal. Speech and behavior are normal. Patient exhibits appropriate insight and judgment.  ____________________________________________    LABS (pertinent positives/negatives)  Labs Reviewed  BASIC METABOLIC PANEL - Abnormal; Notable for the following:       Result Value   Potassium 3.4 (*)    Glucose, Bld 101 (*)    Creatinine, Ser 1.12 (*)    GFR calc non Af Amer 43 (*)    GFR calc Af Amer 50 (*)    All other components within normal limits  URINALYSIS COMPLETEWITH MICROSCOPIC ([facility] ONLY) - Abnormal; Notable for the following:    Color, Urine COLORLESS (*)    APPearance CLEAR (*)    Squamous  Epithelial / LPF 0-5 (*)    All other components within normal limits  CBC WITH DIFFERENTIAL/PLATELET  TROPONIN I     ____________________________________________   EKG  None  ____________________________________________    RADIOLOGY  CT head IMPRESSION:  1. No acute intracranial abnormality.  2. Atrophy with chronic small vessel white matter ischemic disease.      ____________________________________________   PROCEDURES  Procedures  ____________________________________________   INITIAL IMPRESSION / ASSESSMENT AND PLAN / ED COURSE  Pertinent labs & imaging results that were available during my care of the patient were reviewed by me and considered in my medical decision making (see chart for details).  Patient presented to the emergency department today after a normal limits fall. Patient is administered dementia. Workup here without any concerning findings. Daughter did arrive to the emergency department later. I had a discussion with the daughter. He is upset because the patient has had continued falls at her nursing facility. Did discuss with the patient started that she could talk to the staff about switching facilities and I offered  to give the patient our social worker number that she could call back to discuss potential transfer however she declined social work's number. ____________________________________________   FINAL CLINICAL IMPRESSION(S) / ED DIAGNOSES  Final diagnoses:  Fall, initial encounter  Dementia, without behavioral disturbance     Note: This dictation was prepared with Nurse, children's dictation. Any transcriptional errors that result from this process are unintentional    Phineas Semen, MD 08/13/16 1636

## 2017-01-02 DEATH — deceased
